# Patient Record
Sex: Female | Born: 1980 | Race: White | Hispanic: No | Marital: Married | Smoking: Never smoker
Health system: Southern US, Community
[De-identification: ages and names within clinical notes are randomized; demographics above are authoritative.]

## PROBLEM LIST (undated history)

## (undated) DIAGNOSIS — E119 Type 2 diabetes mellitus without complications: Secondary | ICD-10-CM

## (undated) DIAGNOSIS — K589 Irritable bowel syndrome without diarrhea: Secondary | ICD-10-CM

## (undated) DIAGNOSIS — F419 Anxiety disorder, unspecified: Secondary | ICD-10-CM

## (undated) DIAGNOSIS — F32A Depression, unspecified: Secondary | ICD-10-CM

## (undated) DIAGNOSIS — F329 Major depressive disorder, single episode, unspecified: Secondary | ICD-10-CM

## (undated) DIAGNOSIS — K219 Gastro-esophageal reflux disease without esophagitis: Secondary | ICD-10-CM

## (undated) DIAGNOSIS — O24419 Gestational diabetes mellitus in pregnancy, unspecified control: Secondary | ICD-10-CM

## (undated) DIAGNOSIS — J45909 Unspecified asthma, uncomplicated: Secondary | ICD-10-CM

## (undated) DIAGNOSIS — M1990 Unspecified osteoarthritis, unspecified site: Secondary | ICD-10-CM

## (undated) DIAGNOSIS — I5030 Unspecified diastolic (congestive) heart failure: Secondary | ICD-10-CM

## (undated) DIAGNOSIS — T7840XA Allergy, unspecified, initial encounter: Secondary | ICD-10-CM

## (undated) HISTORY — PX: APPENDECTOMY: SHX54

## (undated) HISTORY — DX: Unspecified diastolic (congestive) heart failure: I50.30

## (undated) HISTORY — DX: Major depressive disorder, single episode, unspecified: F32.9

## (undated) HISTORY — DX: Allergy, unspecified, initial encounter: T78.40XA

## (undated) HISTORY — DX: Gastro-esophageal reflux disease without esophagitis: K21.9

## (undated) HISTORY — DX: Anxiety disorder, unspecified: F41.9

## (undated) HISTORY — PX: CHOLECYSTECTOMY: SHX55

## (undated) HISTORY — PX: GANGLION CYST EXCISION: SHX1691

## (undated) HISTORY — DX: Unspecified osteoarthritis, unspecified site: M19.90

## (undated) HISTORY — DX: Irritable bowel syndrome, unspecified: K58.9

## (undated) HISTORY — DX: Unspecified asthma, uncomplicated: J45.909

## (undated) HISTORY — DX: Type 2 diabetes mellitus without complications: E11.9

## (undated) HISTORY — DX: Depression, unspecified: F32.A

---

## 1998-06-24 ENCOUNTER — Other Ambulatory Visit: Admission: RE | Admit: 1998-06-24 | Discharge: 1998-06-24 | Payer: Self-pay | Admitting: Obstetrics & Gynecology

## 1999-05-07 ENCOUNTER — Other Ambulatory Visit: Admission: RE | Admit: 1999-05-07 | Discharge: 1999-05-07 | Payer: Self-pay | Admitting: Gastroenterology

## 1999-05-07 ENCOUNTER — Encounter (INDEPENDENT_AMBULATORY_CARE_PROVIDER_SITE_OTHER): Payer: Self-pay | Admitting: Specialist

## 1999-07-08 ENCOUNTER — Other Ambulatory Visit: Admission: RE | Admit: 1999-07-08 | Discharge: 1999-07-08 | Payer: Self-pay | Admitting: Obstetrics & Gynecology

## 2000-07-09 ENCOUNTER — Other Ambulatory Visit: Admission: RE | Admit: 2000-07-09 | Discharge: 2000-07-09 | Payer: Self-pay | Admitting: Obstetrics and Gynecology

## 2000-07-09 ENCOUNTER — Other Ambulatory Visit: Admission: RE | Admit: 2000-07-09 | Discharge: 2000-07-09 | Payer: Self-pay | Admitting: Gynecology

## 2002-09-19 ENCOUNTER — Other Ambulatory Visit: Admission: RE | Admit: 2002-09-19 | Discharge: 2002-09-19 | Payer: Self-pay | Admitting: Obstetrics & Gynecology

## 2003-09-01 ENCOUNTER — Ambulatory Visit (HOSPITAL_BASED_OUTPATIENT_CLINIC_OR_DEPARTMENT_OTHER): Admission: RE | Admit: 2003-09-01 | Discharge: 2003-09-01 | Payer: Self-pay | Admitting: Internal Medicine

## 2003-09-20 ENCOUNTER — Emergency Department (HOSPITAL_COMMUNITY): Admission: EM | Admit: 2003-09-20 | Discharge: 2003-09-20 | Payer: Self-pay | Admitting: Emergency Medicine

## 2003-11-08 ENCOUNTER — Other Ambulatory Visit: Admission: RE | Admit: 2003-11-08 | Discharge: 2003-11-08 | Payer: Self-pay | Admitting: Obstetrics & Gynecology

## 2004-12-03 ENCOUNTER — Other Ambulatory Visit: Admission: RE | Admit: 2004-12-03 | Discharge: 2004-12-03 | Payer: Self-pay | Admitting: Obstetrics & Gynecology

## 2005-04-14 ENCOUNTER — Inpatient Hospital Stay (HOSPITAL_COMMUNITY): Admission: EM | Admit: 2005-04-14 | Discharge: 2005-04-18 | Payer: Self-pay | Admitting: Emergency Medicine

## 2005-04-14 ENCOUNTER — Ambulatory Visit: Payer: Self-pay | Admitting: Internal Medicine

## 2005-12-14 ENCOUNTER — Other Ambulatory Visit: Admission: RE | Admit: 2005-12-14 | Discharge: 2005-12-14 | Payer: Self-pay | Admitting: Obstetrics & Gynecology

## 2008-01-12 ENCOUNTER — Ambulatory Visit (HOSPITAL_BASED_OUTPATIENT_CLINIC_OR_DEPARTMENT_OTHER): Admission: RE | Admit: 2008-01-12 | Discharge: 2008-01-12 | Payer: Self-pay | Admitting: Orthopedic Surgery

## 2009-11-27 ENCOUNTER — Encounter: Admission: RE | Admit: 2009-11-27 | Discharge: 2010-02-25 | Payer: Self-pay | Admitting: Family Medicine

## 2010-01-06 ENCOUNTER — Ambulatory Visit: Payer: Self-pay | Admitting: Psychiatry

## 2010-01-06 ENCOUNTER — Inpatient Hospital Stay (HOSPITAL_COMMUNITY): Admission: RE | Admit: 2010-01-06 | Discharge: 2010-01-12 | Payer: Self-pay | Admitting: Psychiatry

## 2010-01-14 ENCOUNTER — Ambulatory Visit: Payer: Self-pay | Admitting: Psychiatry

## 2010-01-14 ENCOUNTER — Other Ambulatory Visit (HOSPITAL_COMMUNITY): Admission: RE | Admit: 2010-01-14 | Discharge: 2010-01-27 | Payer: Self-pay | Admitting: Psychiatry

## 2010-06-18 ENCOUNTER — Ambulatory Visit (HOSPITAL_COMMUNITY): Admission: RE | Admit: 2010-06-18 | Discharge: 2010-06-18 | Payer: Self-pay | Admitting: Gastroenterology

## 2010-06-19 ENCOUNTER — Emergency Department (HOSPITAL_COMMUNITY): Admission: EM | Admit: 2010-06-19 | Discharge: 2010-06-20 | Payer: Self-pay | Admitting: Emergency Medicine

## 2010-08-01 ENCOUNTER — Ambulatory Visit (HOSPITAL_COMMUNITY): Admission: RE | Admit: 2010-08-01 | Discharge: 2010-08-01 | Payer: Self-pay | Admitting: General Surgery

## 2010-08-04 ENCOUNTER — Ambulatory Visit (HOSPITAL_COMMUNITY): Admission: RE | Admit: 2010-08-04 | Discharge: 2010-08-04 | Payer: Self-pay | Admitting: General Surgery

## 2010-08-11 ENCOUNTER — Encounter: Admission: RE | Admit: 2010-08-11 | Discharge: 2010-08-11 | Payer: Self-pay | Admitting: General Surgery

## 2010-10-23 ENCOUNTER — Encounter
Admission: RE | Admit: 2010-10-23 | Discharge: 2010-11-28 | Payer: Self-pay | Source: Home / Self Care | Attending: Family Medicine | Admitting: Family Medicine

## 2010-11-28 ENCOUNTER — Encounter
Admission: RE | Admit: 2010-11-28 | Discharge: 2010-12-23 | Payer: Self-pay | Source: Home / Self Care | Attending: Family Medicine | Admitting: Family Medicine

## 2011-01-27 ENCOUNTER — Encounter: Payer: Commercial Indemnity | Attending: Family Medicine | Admitting: Dietician

## 2011-01-27 DIAGNOSIS — Z713 Dietary counseling and surveillance: Secondary | ICD-10-CM | POA: Insufficient documentation

## 2011-01-27 DIAGNOSIS — E119 Type 2 diabetes mellitus without complications: Secondary | ICD-10-CM | POA: Insufficient documentation

## 2011-02-05 LAB — URINALYSIS, ROUTINE W REFLEX MICROSCOPIC
Bilirubin Urine: NEGATIVE
Glucose, UA: NEGATIVE mg/dL
Ketones, ur: NEGATIVE mg/dL
Nitrite: NEGATIVE
Protein, ur: NEGATIVE mg/dL
Specific Gravity, Urine: 1.004 — ABNORMAL LOW (ref 1.005–1.030)
Urobilinogen, UA: 0.2 mg/dL (ref 0.0–1.0)
pH: 7 (ref 5.0–8.0)

## 2011-02-05 LAB — AMYLASE: Amylase: 52 U/L (ref 0–105)

## 2011-02-05 LAB — CBC
HCT: 38.3 % (ref 36.0–46.0)
HCT: 41.9 % (ref 36.0–46.0)
Hemoglobin: 12.9 g/dL (ref 12.0–15.0)
Hemoglobin: 14.2 g/dL (ref 12.0–15.0)
MCH: 29.9 pg (ref 26.0–34.0)
MCH: 30 pg (ref 26.0–34.0)
MCHC: 33.8 g/dL (ref 30.0–36.0)
MCHC: 33.8 g/dL (ref 30.0–36.0)
MCV: 88.4 fL (ref 78.0–100.0)
MCV: 88.5 fL (ref 78.0–100.0)
Platelets: 258 10*3/uL (ref 150–400)
Platelets: 324 10*3/uL (ref 150–400)
RBC: 4.33 MIL/uL (ref 3.87–5.11)
RBC: 4.74 MIL/uL (ref 3.87–5.11)
RDW: 13.8 % (ref 11.5–15.5)
RDW: 14.1 % (ref 11.5–15.5)
WBC: 7.9 10*3/uL (ref 4.0–10.5)
WBC: 9.7 10*3/uL (ref 4.0–10.5)

## 2011-02-05 LAB — GLUCOSE, CAPILLARY
Glucose-Capillary: 113 mg/dL — ABNORMAL HIGH (ref 70–99)
Glucose-Capillary: 90 mg/dL (ref 70–99)

## 2011-02-05 LAB — COMPREHENSIVE METABOLIC PANEL
ALT: 21 U/L (ref 0–35)
ALT: 26 U/L (ref 0–35)
AST: 21 U/L (ref 0–37)
AST: 23 U/L (ref 0–37)
Albumin: 3.5 g/dL (ref 3.5–5.2)
Albumin: 3.7 g/dL (ref 3.5–5.2)
Alkaline Phosphatase: 78 U/L (ref 39–117)
Alkaline Phosphatase: 87 U/L (ref 39–117)
BUN: 5 mg/dL — ABNORMAL LOW (ref 6–23)
BUN: 7 mg/dL (ref 6–23)
CO2: 27 mEq/L (ref 19–32)
CO2: 29 mEq/L (ref 19–32)
Calcium: 8.7 mg/dL (ref 8.4–10.5)
Calcium: 9.4 mg/dL (ref 8.4–10.5)
Chloride: 105 mEq/L (ref 96–112)
Chloride: 107 mEq/L (ref 96–112)
Creatinine, Ser: 0.63 mg/dL (ref 0.4–1.2)
Creatinine, Ser: 0.78 mg/dL (ref 0.4–1.2)
GFR calc Af Amer: >60 mL/min (ref >60)
GFR calc Af Amer: >60 mL/min (ref >60)
GFR calc non Af Amer: >60 mL/min (ref >60)
GFR calc non Af Amer: >60 mL/min (ref >60)
Glucose, Bld: 102 mg/dL — ABNORMAL HIGH (ref 70–99)
Glucose, Bld: 117 mg/dL — ABNORMAL HIGH (ref 70–99)
Potassium: 3.8 mEq/L (ref 3.5–5.1)
Potassium: 3.9 mEq/L (ref 3.5–5.1)
Sodium: 137 mEq/L (ref 135–145)
Sodium: 143 mEq/L (ref 135–145)
Total Bilirubin: 0.3 mg/dL (ref 0.3–1.2)
Total Bilirubin: 0.5 mg/dL (ref 0.3–1.2)
Total Protein: 7.1 g/dL (ref 6.0–8.3)
Total Protein: 7.3 g/dL (ref 6.0–8.3)

## 2011-02-05 LAB — DIFFERENTIAL
Basophils Absolute: 0 10*3/uL (ref 0.0–0.1)
Basophils Absolute: 0.1 10*3/uL (ref 0.0–0.1)
Basophils Relative: 1 % (ref 0–1)
Basophils Relative: 1 % (ref 0–1)
Eosinophils Absolute: 0.1 10*3/uL (ref 0.0–0.7)
Eosinophils Absolute: 0.1 10*3/uL (ref 0.0–0.7)
Eosinophils Relative: 1 % (ref 0–5)
Eosinophils Relative: 1 % (ref 0–5)
Lymphocytes Relative: 20 % (ref 12–46)
Lymphocytes Relative: 25 % (ref 12–46)
Lymphs Abs: 1.6 10*3/uL (ref 0.7–4.0)
Lymphs Abs: 2.5 10*3/uL (ref 0.7–4.0)
Monocytes Absolute: 0.3 10*3/uL (ref 0.1–1.0)
Monocytes Absolute: 0.7 10*3/uL (ref 0.1–1.0)
Monocytes Relative: 4 % (ref 3–12)
Monocytes Relative: 7 % (ref 3–12)
Neutro Abs: 5.8 10*3/uL (ref 1.7–7.7)
Neutro Abs: 6.4 10*3/uL (ref 1.7–7.7)
Neutrophils Relative %: 66 % (ref 43–77)
Neutrophils Relative %: 74 % (ref 43–77)

## 2011-02-05 LAB — SURGICAL PCR SCREEN: Staphylococcus aureus: POSITIVE — AB

## 2011-02-05 LAB — LIPASE, BLOOD: Lipase: 22 U/L (ref 11–59)

## 2011-02-05 LAB — URINE MICROSCOPIC-ADD ON

## 2011-02-05 LAB — PREGNANCY, URINE: Preg Test, Ur: NEGATIVE

## 2011-02-07 LAB — COMPREHENSIVE METABOLIC PANEL
ALT: 18 U/L (ref 0–35)
AST: 20 U/L (ref 0–37)
Albumin: 3.5 g/dL (ref 3.5–5.2)
Alkaline Phosphatase: 71 U/L (ref 39–117)
BUN: 10 mg/dL (ref 6–23)
CO2: 25 mEq/L (ref 19–32)
Calcium: 9 mg/dL (ref 8.4–10.5)
Chloride: 106 mEq/L (ref 96–112)
Creatinine, Ser: 0.72 mg/dL (ref 0.4–1.2)
GFR calc Af Amer: >60 mL/min (ref >60)
GFR calc non Af Amer: >60 mL/min (ref >60)
Glucose, Bld: 95 mg/dL (ref 70–99)
Potassium: 3.2 mEq/L — ABNORMAL LOW (ref 3.5–5.1)
Sodium: 138 mEq/L (ref 135–145)
Total Bilirubin: 0.5 mg/dL (ref 0.3–1.2)
Total Protein: 6.9 g/dL (ref 6.0–8.3)

## 2011-02-07 LAB — DIFFERENTIAL
Basophils Absolute: 0 10*3/uL (ref 0.0–0.1)
Basophils Relative: 0 % (ref 0–1)
Eosinophils Absolute: 0 10*3/uL (ref 0.0–0.7)
Lymphs Abs: 1.9 10*3/uL (ref 0.7–4.0)
Neutrophils Relative %: 77 % (ref 43–77)

## 2011-02-07 LAB — CBC
HCT: 38.5 % (ref 36.0–46.0)
Hemoglobin: 13.3 g/dL (ref 12.0–15.0)
MCH: 30.3 pg (ref 26.0–34.0)
MCHC: 34.5 g/dL (ref 30.0–36.0)
MCV: 87.7 fL (ref 78.0–100.0)
Platelets: 278 10*3/uL (ref 150–400)
RBC: 4.39 MIL/uL (ref 3.87–5.11)
RDW: 13.1 % (ref 11.5–15.5)
WBC: 11 10*3/uL — ABNORMAL HIGH (ref 4.0–10.5)

## 2011-02-07 LAB — URINALYSIS, ROUTINE W REFLEX MICROSCOPIC
Ketones, ur: NEGATIVE mg/dL
Nitrite: NEGATIVE
Protein, ur: NEGATIVE mg/dL
Urobilinogen, UA: 0.2 mg/dL (ref 0.0–1.0)
pH: 6.5 (ref 5.0–8.0)

## 2011-02-07 LAB — LIPASE, BLOOD: Lipase: 25 U/L (ref 11–59)

## 2011-02-12 LAB — DRUGS OF ABUSE SCREEN W/O ALC, ROUTINE URINE
Amphetamine Screen, Ur: NEGATIVE
Barbiturate Quant, Ur: NEGATIVE
Marijuana Metabolite: NEGATIVE
Methadone: NEGATIVE
Phencyclidine (PCP): NEGATIVE

## 2011-02-12 LAB — URINALYSIS, MICROSCOPIC ONLY
Bilirubin Urine: NEGATIVE
Glucose, UA: NEGATIVE mg/dL
Ketones, ur: NEGATIVE mg/dL
Specific Gravity, Urine: 1.03 (ref 1.005–1.030)
pH: 6.5 (ref 5.0–8.0)

## 2011-02-12 LAB — PREGNANCY, URINE: Preg Test, Ur: NEGATIVE

## 2011-02-12 LAB — CBC
HCT: 39.7 % (ref 36.0–46.0)
Hemoglobin: 13.6 g/dL (ref 12.0–15.0)
Platelets: 248 10*3/uL (ref 150–400)
WBC: 3.8 10*3/uL — ABNORMAL LOW (ref 4.0–10.5)

## 2011-02-12 LAB — URINE DRUGS OF ABUSE SCREEN W ALC, ROUTINE (REF LAB)
Barbiturate Quant, Ur: NEGATIVE
Benzodiazepines.: NEGATIVE
Cocaine Metabolites: NEGATIVE
Creatinine,U: 81 mg/dL
Methadone: NEGATIVE
Phencyclidine (PCP): NEGATIVE
Propoxyphene: NEGATIVE

## 2011-02-12 LAB — COMPREHENSIVE METABOLIC PANEL
ALT: 20 U/L (ref 0–35)
Albumin: 3.3 g/dL — ABNORMAL LOW (ref 3.5–5.2)
Alkaline Phosphatase: 61 U/L (ref 39–117)
BUN: 5 mg/dL — ABNORMAL LOW (ref 6–23)
Chloride: 106 mEq/L (ref 96–112)
Glucose, Bld: 115 mg/dL — ABNORMAL HIGH (ref 70–99)
Potassium: 3.1 mEq/L — ABNORMAL LOW (ref 3.5–5.1)
Sodium: 139 mEq/L (ref 135–145)
Total Bilirubin: 0.2 mg/dL — ABNORMAL LOW (ref 0.3–1.2)
Total Protein: 6.6 g/dL (ref 6.0–8.3)

## 2011-03-30 ENCOUNTER — Other Ambulatory Visit: Payer: Self-pay | Admitting: Obstetrics & Gynecology

## 2011-04-07 NOTE — Op Note (Signed)
[name], [name]               ACCOUNT NO.:  [id number]   MEDICAL RECORD NO.:  [id number]          PATIENT TYPE:  AMB   LOCATION:  DSC                          FACILITY:  [facility]   PHYSICIAN:  Rodney A. Mortenson, M.D.DATE OF BIRTH:  Nov 18, 1981   DATE OF PROCEDURE:  01/12/2008  DATE OF DISCHARGE:                               OPERATIVE REPORT   JUSTIFICATION:  30 year old female has had increasing pain about right  wrist the past three or four months.  She does repetitive motion typing,  lifting and noted to have a ganglion on the dorsum of the right wrist.  She was seen in October of 2008.  This was treated conservatively.  An  attempted aspiration was done.  This was unsuccessful and the ganglion  continued to grow and become painful.  The patient now wishes to have  surgical excision.  Questions answered and encouraged.  Complications  discussed extensively preoperatively, recurrence of ganglion and  multiple other complications and the patient wished to proceed.   JUSTIFICATION FOR OUTPATIENT SURGERY:  Minimal morbidity.   PREOPERATIVE DIAGNOSIS:  Ganglion of the dorsal aspect of the right  wrist.   POSTOPERATIVE DIAGNOSIS:  Ganglion of the dorsal aspect of the right  wrist.   OPERATION:  Excision ganglion dorsal aspect right wrist arising from the  radial, lunate, scaphoid articulation   SURGEON:  Thereasa Distance A. Chaney Malling, M.D.   ANESTHESIA:  General.   PROCEDURE:  The patient placed on the operating table in supine  position.  Pneumatic tourniquet about the right upper arm.  Right upper  extremity prepped with DuraPrep and draped out in the usual manner.  The  arm was then wrapped out in Esmarch, tourniquet was elevated.  Loupe  magnification was used throughout.  Transverse incision following a skin  crease on dorsal aspect of the wrist was done.  Blunt dissection was  then used to get down to the dorsal capsule.  Bleeders were coagulated.  Small retractor placed about  the wound.  Blunt dissection carried down  to the base of the dorsal capsule and this was fairly large sessile  footprint to the gangrene.  The capsule was then incised with scissors  and the entire ganglion excised as a single large mass.  The scaphoid,  lunate, radial articulation could be seen.  The wound was dry.  A small  piece Gelfoam was then placed in the wound and 3-0 subcuticular suture  and Steri-Strips were applied.  The area was then infiltrated with  Marcaine.  A large bulky sterile dressing was applied and the patient  returned to recovery room in excellent condition.  Technically this  procedure went extremely well.   FOLLOW-UP CARE:  1. Darvocet for pain.  2. Return to my office on Friday.  3. Usual postop instructions were given.      Rodney A. Chaney Malling, M.D.  Electronically Signed     RAM/MEDQ  D:  01/12/2008  T:  01/13/2008  Job:  [id number]
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
## 2011-04-10 NOTE — Op Note (Signed)
NAMETYERRA, LORETTO NO.:  [id number]   MEDICAL RECORD NO.:  [id number]          PATIENT TYPE:  INP   LOCATION:                               FACILITY:  [facility]   PHYSICIAN:  Doylene Canning. Ladona Ridgel, M.D.  DATE OF BIRTH:  08/06/1981   DATE OF PROCEDURE:  04/17/2005  DATE OF DISCHARGE:                                 OPERATIVE REPORT   PROCEDURE PERFORMED:  Head-up tilt table testing.   INDICATIONS:  Unexplained syncope.   I INTRODUCTION:  The patient is a 30 year old woman who was admitted to  hospital after sustaining a syncopal episode. She carries a questionable  history of prolongation of her QT interval, but has had no family history of  sudden cardiac death. Her 2-D echo demonstrated normal LV systolic function  with no segmental wall motion abnormalities and no evidence of hypertrophic  cardiomyopathy. The patient has had a history of hypoglycemia. She is now  referred for head-up tilt table testing.   II. PROCEDURE:  After informed consent was obtained, the patient was taken  to the diagnostic catheterization lab in the fasting state. After the usual  preparation, she was placed in the supine position where blood pressure was  in the 120s with heart rates in the 70s.   She was placed in the 70 degree, head-up, tilt table position. Her heart  rate increased from the mid-70s,  appropriately, up into the mid-to-high 80s  and finally up into the mid-90s. Her blood pressure remained stable without  any significant change. She was maintained in this position for  approximately 30 minutes. During this time her heart rate remained 90 range  and her blood pressure remained in the 120-130 range. She was subsequently  returned back to supine position and isoproterenol at 0.5 mcg/min was  infused. Despite an increase in her heart rate. The patient had no  significant change in her blood pressure. She complained of anxiousness, and  actually began to cry when her blood  pressure cuff was inflated. After an  initial 10 minutes of tilting on isoproterenol without any syncope. The  patient was returned back to the supine position.  Her isoproterenol was  discontinued and she was returned to her room in satisfactory condition.   III. COMPLICATIONS:  There were no major procedure complications.   IV. RESULTS:  This study demonstrates negative head-up tilt table testing  for inducible syncope and no evidence of any venous pooling.       ___________________________________________  Doylene Canning. Ladona Ridgel, M.D.    GWT/MEDQ  D:  04/17/2005  T:  04/17/2005  Job:  [id number]   cc:   Vale Haven. Andrey Campanile, M.D.  [address]  [place]  [place] [address]  Fax: [phone]

## 2011-04-28 ENCOUNTER — Ambulatory Visit: Payer: Self-pay | Admitting: Dietician

## 2011-08-14 LAB — BASIC METABOLIC PANEL
BUN: 8
CO2: 25
Chloride: 106
Creatinine, Ser: 0.64
Glucose, Bld: 83
Potassium: 4.1

## 2011-08-14 LAB — POCT HEMOGLOBIN-HEMACUE: Hemoglobin: 13.6

## 2012-09-16 IMAGING — CT CT ANGIO CHEST
2 of 6 series · 18 of 36 positions shown · IV contrast ([ID] OMNI 300)
Comparison: None.

CLINICAL DATA: Pleuritic right sided pain, cough

CT ANGIOGRAPHY CHEST WITH CONTRAST
TECHNIQUE: Multidetector CT imaging of the chest was performed
using the standard protocol during bolus administration of
intravenous contrast.  Multiplanar CT image reconstructions
including MIPs were obtained to evaluate the vascular anatomy.
Contrast:  125 ml [id number]

[Series 6: pe thin 1.25 · axial · 0.60mm/px · z∈[-228,+6]mm · 17 of 211 slices shown]
[im 12/211  lung]
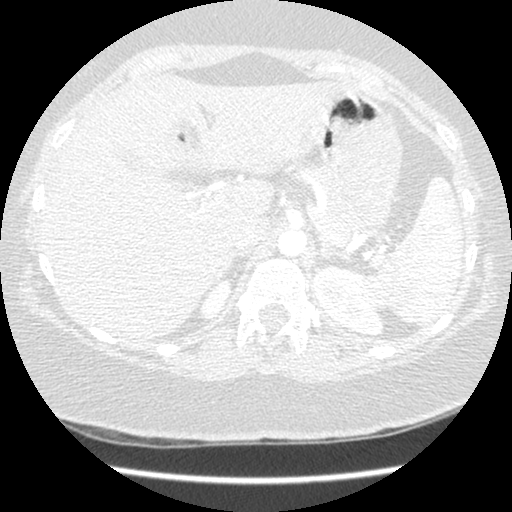
[im 24/211  mediastinal]
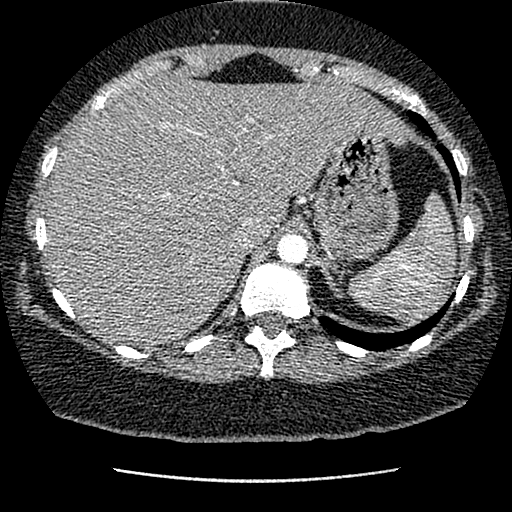
[im 36/211  lung]
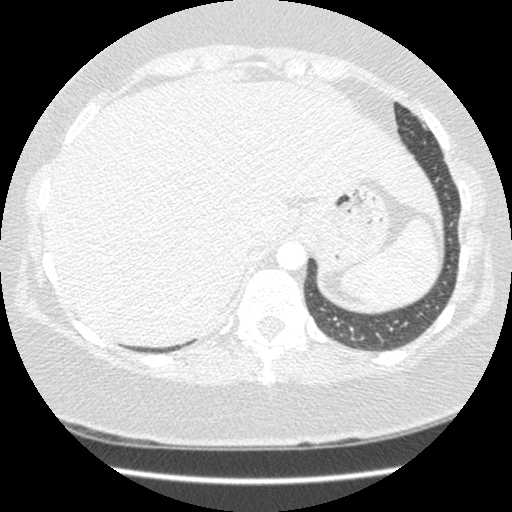
[im 47/211  mediastinal]
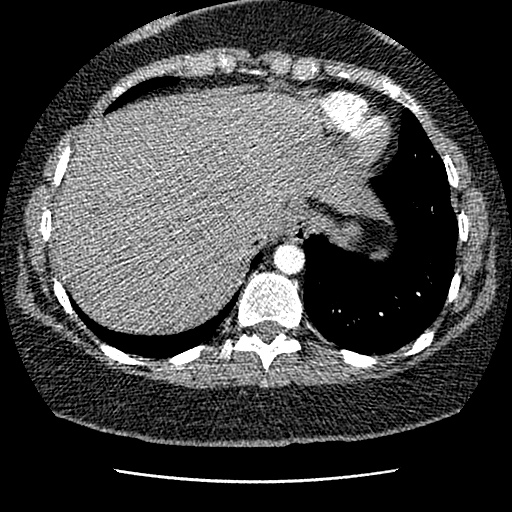
[im 59/211  lung]
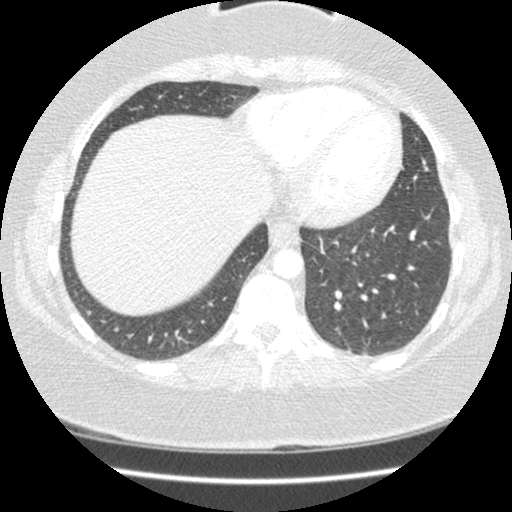
[im 71/211  mediastinal]
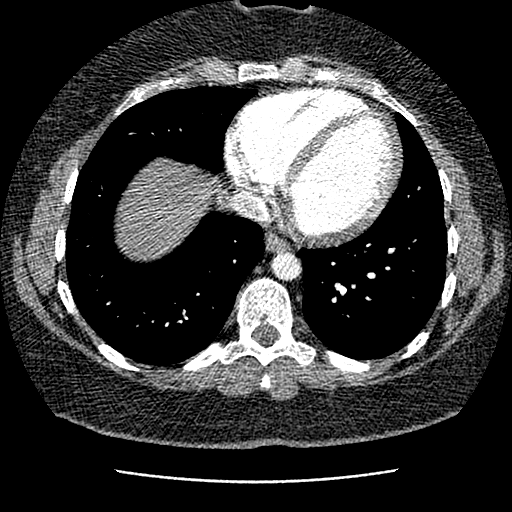
[im 82/211  lung]
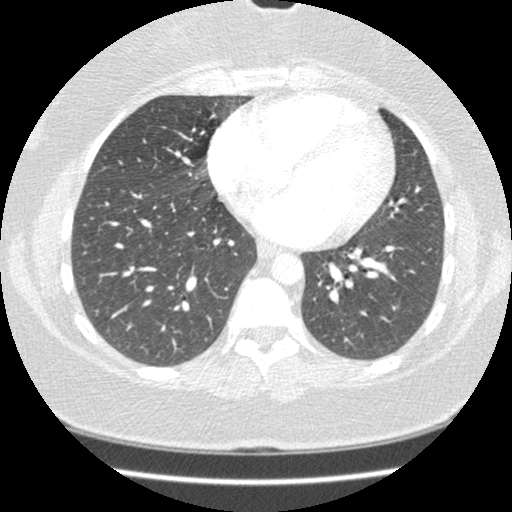
[im 94/211  mediastinal]
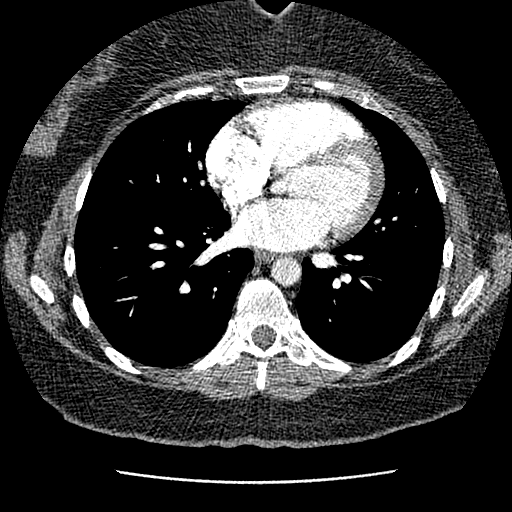
[im 106/211  lung]
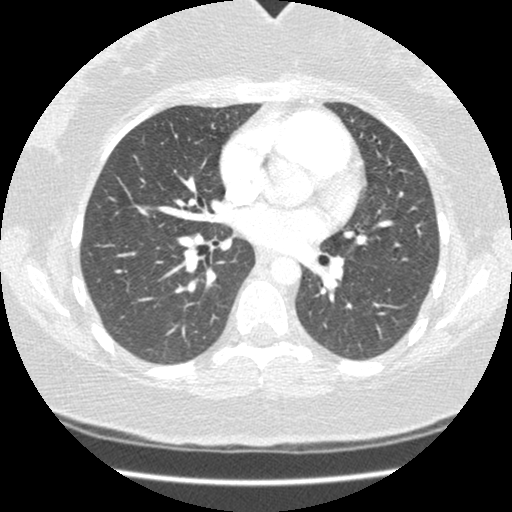
[im 117/211  mediastinal]
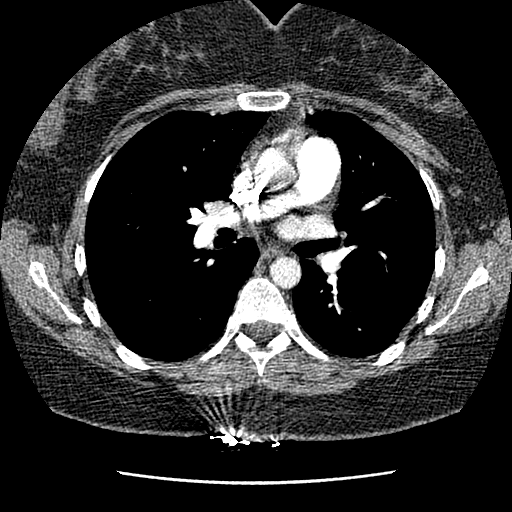
[im 129/211  lung]
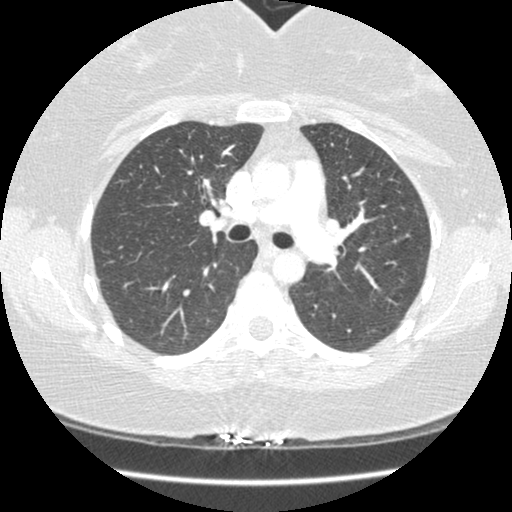
[im 141/211  mediastinal]
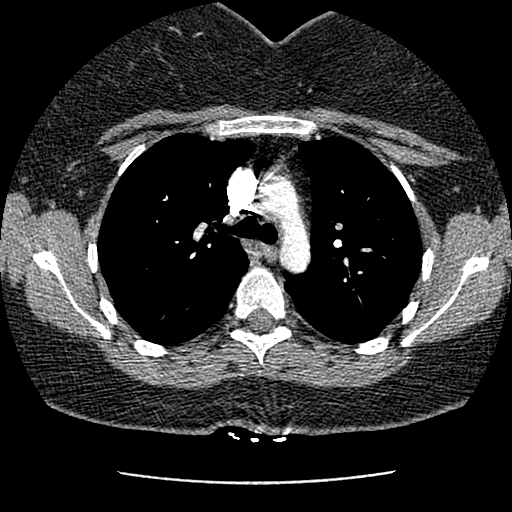
[im 152/211  lung]
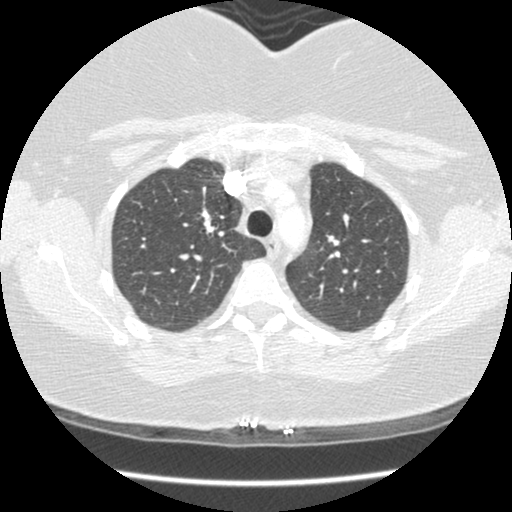
[im 164/211  mediastinal]
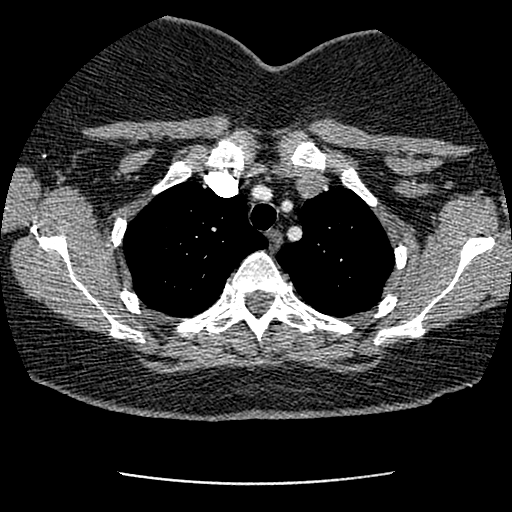
[im 176/211  lung]
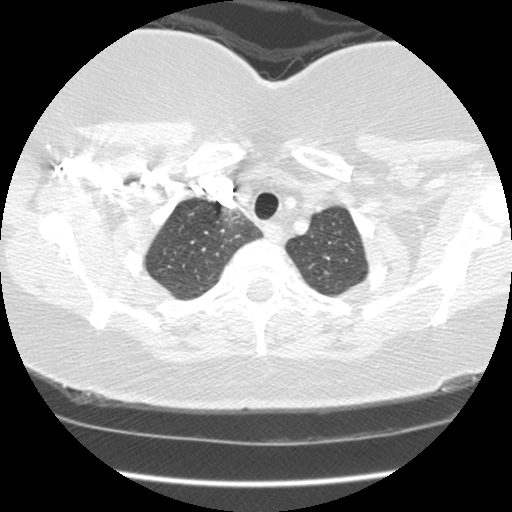
[im 187/211  mediastinal]
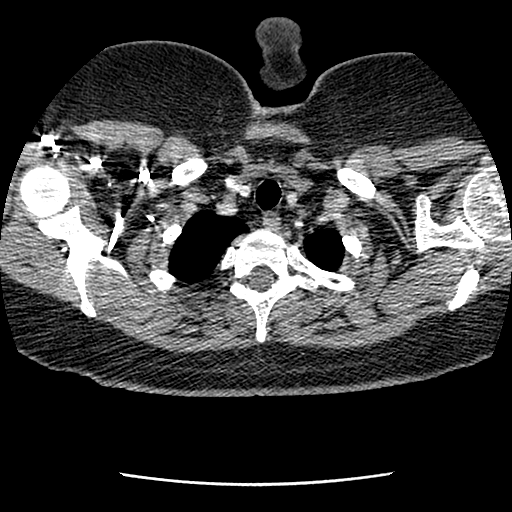
[im 199/211  lung]
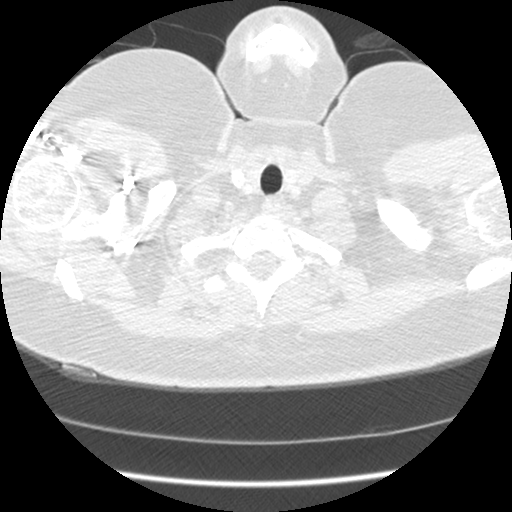

[Series 105: cor · coronal · 0.60mm/px · 1 of 105 slices shown]
[im 53/105  mediastinal]
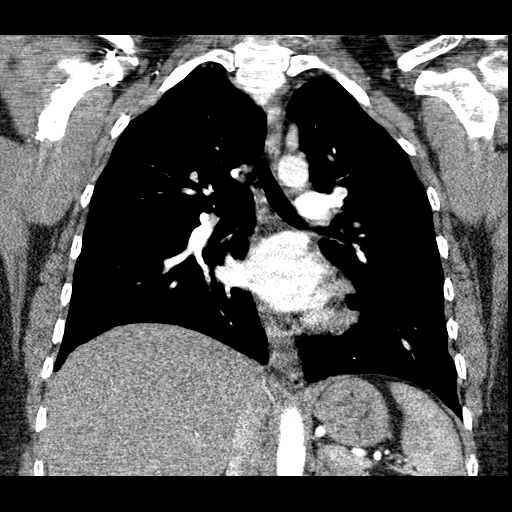

[18 of 36 positions shown; findings below may reference images not displayed]

FINDINGS: The pulmonary arteries opacify and there is no evidence
of pulmonary embolism.  Streak artifacts are noted particularly in
the lower lung fields due to patient body habitus.  The thoracic
aorta opacifies with no significant abnormality noted.  No
mediastinal or hilar adenopathy is seen.

On the lung window images, no focal infiltrate is seen.  There is
no evidence of pleural effusion.  No bony abnormality is seen.  The
origins of the great vessels are patent. Surgical clips are present
in the right upper quadrant from recent cholecystectomy.

Review of the MIP images confirms the above findings.
IMPRESSION: No evidence of pulmonary embolism.  Negative CT angiogram of the
chest.

## 2012-10-13 ENCOUNTER — Ambulatory Visit (INDEPENDENT_AMBULATORY_CARE_PROVIDER_SITE_OTHER): Payer: Managed Care, Other (non HMO) | Admitting: Family Medicine

## 2012-10-13 VITALS — BP 116/76 | HR 103 | Temp 98.4°F | Resp 16 | Ht 62.5 in | Wt 196.0 lb

## 2012-10-13 DIAGNOSIS — K645 Perianal venous thrombosis: Secondary | ICD-10-CM

## 2012-10-13 DIAGNOSIS — K6289 Other specified diseases of anus and rectum: Secondary | ICD-10-CM

## 2012-10-13 MED ORDER — HYDROCORTISONE ACETATE 25 MG RE SUPP: 25.0000 mg | Freq: Two times a day (BID) | RECTAL | Status: DC | PRN

## 2012-10-13 MED ORDER — HYDROCORTISONE 2.5 % RE CREA: TOPICAL_CREAM | Freq: Two times a day (BID) | RECTAL | Status: DC

## 2012-10-13 MED ORDER — LIDOCAINE (ANORECTAL) 5 % EX CREA: 1.0000 "application " | TOPICAL_CREAM | Freq: Three times a day (TID) | CUTANEOUS | Status: DC | PRN

## 2012-10-13 NOTE — Progress Notes (Signed)
[facility]:  Office Visit  Chief Complaint:  Chief Complaint  Patient presents with  . Hemorrhoids    HPI: [name] is a 31 y.o. female who complains of  H/o IBS constipation, has had multiple episodes of nonbloody diarrhea, bloating, + itching in last 3 days started having worsening rectal pain with BM. She has never had hemorrhoids before. Has not tried anything for this except her usual IBS meds. Denies any melena/hematochezia  Past Medical History  Diagnosis Date  . Allergy   . Arthritis   . Anxiety   . Asthma   . Depression   . GERD (gastroesophageal reflux disease)   . IBS (irritable bowel syndrome)   . Diabetes mellitus without complication    Past Surgical History  Procedure Date  . Cholecystectomy   . Ganglion cyst excision    History   Social History  . Marital Status: Married    Spouse Name: N/A    Number of Children: N/A  . Years of Education: N/A   Social History Main Topics  . Smoking status: Never Smoker   . Smokeless tobacco: None  . Alcohol Use: No  . Drug Use: No  . Sexually Active: Yes    Birth Control/ Protection: Pill   Other Topics Concern  . None   Social History Narrative  . None   Family History  Problem Relation Age of Onset  . Diabetes Mother   . Hypertension Mother   . Arthritis Mother   . Diabetes Father   . Heart disease Father   . Depression Father    Allergies  Allergen Reactions  . Codeine Nausea And Vomiting  . Levaquin (Levofloxacin In D5w)   . Seroquel (Quetiapine Fumarate)   . Vicodin (Hydrocodone-Acetaminophen) Nausea And Vomiting  . Wellbutrin (Bupropion) Other (See Comments)    Seizures   Prior to Admission medications   Medication Sig Start Date End Date Taking? Authorizing Provider  cholecalciferol (VITAMIN D) 1000 UNITS tablet Take 1,000 Units by mouth daily.   Yes Historical Provider, MD  clorazepate (TRANXENE) 7.5 MG tablet Take 7.5 mg by mouth 2 (two) times daily as needed.   Yes  Historical Provider, MD  FLUoxetine (PROZAC) 20 MG capsule Take 20 mg by mouth daily.   Yes Historical Provider, MD  Linaclotide (LINZESS) 145 MCG CAPS Take 145 mcg by mouth daily.   Yes Historical Provider, MD  Multiple Vitamin (MULTIVITAMIN) tablet Take 1 tablet by mouth daily.   Yes Historical Provider, MD  Norethindrone-Ethinyl Estradiol-Fe Biphas (LO LOESTRIN FE) 1 MG-10 MCG / 10 MCG tablet Take 1 tablet by mouth daily.   Yes Historical Provider, MD  omeprazole (PRILOSEC) 20 MG capsule Take 20 mg by mouth daily.   Yes Historical Provider, MD  phentermine 37.5 MG capsule Take 37.5 mg by mouth every morning.   Yes Historical Provider, MD  Probiotic Product (ALIGN PO) Take by mouth daily.   Yes Historical Provider, MD     ROS: The patient denies fevers, chills, night sweats, unintentional weight loss, chest pain, palpitations, wheezing, dyspnea on exertion, nausea, vomiting, abdominal pain, dysuria, hematuria, melena, numbness, weakness, or tingling.   All other systems have been reviewed and were otherwise negative with the exception of those mentioned in the HPI and as above.    PHYSICAL EXAM: Filed Vitals:   10/13/12 1813  BP: 116/76  Pulse: 103  Temp: 98.4 F (36.9 C)  Resp: 16   Filed Vitals:   10/13/12 1813  Height:  5' 2.5" (1.588 m)  Weight: 196 lb (88.905 kg)   Body mass index is 35.28 kg/(m^2).  General: Alert, no acute distress HEENT:  Normocephalic, atraumatic, oropharynx patent.  Cardiovascular:  Regular rate and rhythm, no rubs murmurs or gallops.  No Carotid bruits, radial pulse intact. No pedal edema.  Respiratory: Clear to auscultation bilaterally.  No wheezes, rales, or rhonchi.  No cyanosis, no use of accessory musculature GI: No organomegaly, abdomen is soft and non-tender, positive bowel sounds.  No masses. Skin: No rashes. Neurologic: Facial musculature symmetric. Psychiatric: Patient is appropriate throughout our interaction. Lymphatic: No cervical  lymphadenopathy Musculoskeletal: Gait intact. Rectal exam-+ 1 thrombosed external hemorrhoid, no internal hemorrhoids appreciated   LABS: Results for orders placed during the hospital encounter of 08/04/10  AMYLASE      Component Value Range   Amylase 52  0 - 105 U/L  CBC      Component Value Range   WBC 9.7  4.0 - 10.5 K/uL   RBC 4.74  3.87 - 5.11 MIL/uL   Hemoglobin 14.2  12.0 - 15.0 g/dL   HCT 78.2  95.6 - 21.3 %   MCV 88.5  78.0 - 100.0 fL   MCH 30.0  26.0 - 34.0 pg   MCHC 33.8  30.0 - 36.0 g/dL   RDW 08.6  57.8 - 46.9 %   Platelets 324  150 - 400 K/uL  COMPREHENSIVE METABOLIC PANEL      Component Value Range   Sodium 143  135 - 145 mEq/L   Potassium 3.8  3.5 - 5.1 mEq/L   Chloride 107  96 - 112 mEq/L   CO2 29  19 - 32 mEq/L   Glucose, Bld 102 (*) 70 - 99 mg/dL   BUN 7  6 - 23 mg/dL   Creatinine, Ser 6.29  0.4 - 1.2 mg/dL   Calcium 9.4  8.4 - 52.8 mg/dL   Total Protein 7.3  6.0 - 8.3 g/dL   Albumin 3.7  3.5 - 5.2 g/dL   AST 21  0 - 37 U/L   ALT 26  0 - 35 U/L   Alkaline Phosphatase 78  39 - 117 U/L   Total Bilirubin 0.3  0.3 - 1.2 mg/dL   GFR calc non Af Amer >60  >60 mL/min   GFR calc Af Amer    >60 mL/min   Value: >60            The eGFR has been calculated     using the MDRD equation.     This calculation has not been     validated in all clinical     situations.     eGFR's persistently     <60 mL/min signify     possible Chronic Kidney Disease.  DIFFERENTIAL      Component Value Range   Neutrophils Relative 66  43 - 77 %   Neutro Abs 6.4  1.7 - 7.7 K/uL   Lymphocytes Relative 25  12 - 46 %   Lymphs Abs 2.5  0.7 - 4.0 K/uL   Monocytes Relative 7  3 - 12 %   Monocytes Absolute 0.7  0.1 - 1.0 K/uL   Eosinophils Relative 1  0 - 5 %   Eosinophils Absolute 0.1  0.0 - 0.7 K/uL   Basophils Relative 1  0 - 1 %   Basophils Absolute 0.1  0.0 - 0.1 K/uL  LIPASE, BLOOD      Component Value Range  Lipase 22  11 - 59 U/L     EKG/XRAY:   Primary read  interpreted by Dr. Conley Rolls at [facility].   ASSESSMENT/PLAN: Encounter Diagnosis  Name Primary?  . Hemorrhoid thrombosis Yes   S/p I&D Wound care as directed Rx Anusol suppositories and rcream Also rx lidocaine cream for pain prn F/u as directed   LE, THAO PHUONG, DO 10/13/2012 7:01 PM

## 2012-10-13 NOTE — Progress Notes (Signed)
  Subjective:    Patient ID: [name], female    DOB: 03/20/1981, 31 y.o.   MRN: [id number]  HPI    Review of Systems     Objective:   Physical Exam   Patient consented to procedure:  patient placed in Left lateral decubitus position.Betadine prep X 2. 2.5 cc lidocaine with epi injected locally into hemorrhoid. #11 blade to make 1 cm incision.  Dissected down to thromboses with iris scissors and hemostats. Multiple clots extracted. No thromboses remain.     Assessment & Plan:  Wound care discussed.  Sitz baths. Maxi pad for bandaging.

## 2012-10-14 NOTE — Patient Instructions (Addendum)

## 2013-04-16 ENCOUNTER — Ambulatory Visit (INDEPENDENT_AMBULATORY_CARE_PROVIDER_SITE_OTHER): Payer: Managed Care, Other (non HMO) | Admitting: Family Medicine

## 2013-04-16 VITALS — BP 120/82 | HR 90 | Temp 99.1°F | Resp 18 | Ht 63.0 in | Wt 195.0 lb

## 2013-04-16 DIAGNOSIS — T7840XA Allergy, unspecified, initial encounter: Secondary | ICD-10-CM

## 2013-04-16 DIAGNOSIS — F329 Major depressive disorder, single episode, unspecified: Secondary | ICD-10-CM | POA: Insufficient documentation

## 2013-04-16 DIAGNOSIS — F32A Depression, unspecified: Secondary | ICD-10-CM | POA: Insufficient documentation

## 2013-04-16 DIAGNOSIS — E119 Type 2 diabetes mellitus without complications: Secondary | ICD-10-CM

## 2013-04-16 MED ORDER — PREDNISONE 20 MG PO TABS: ORAL_TABLET | ORAL | Status: DC

## 2013-04-16 NOTE — Progress Notes (Signed)
[facility] [address] [place] [address] [phone]  Date:  04/16/2013   Name:  [name]   DOB:  11/08/1981   MRN:  [id number]  PCP:  Delorse Lek, MD    Chief Complaint: Allergic Reaction   History of Present Illness:  [name] is a 32 y.o. very pleasant female patient who presents with the following:  Yesterday she participated in a "color run." She got paint and powder on herself. The powder consisted of cornstarch and neon paint. She smelled sulfur in the air at the end of the race.  After the race was over she started sneezing- she is still sneezing a lot today.   She has also noted runny nose.  She tried an OTC steroid nasal spray- nasacort. She has also taken claritin- once last night.    She is taking fetzima which is a new SNRI for depression.  She started taking this about one month ago, and is not on any other new medications.  She is tapering off of cymbalta and taking just a little bit now prn.    She has a history of diabetes.  However, she does not currently have diabetes.   She had one seizure thought due to wellbutrin in 2006.  No seizures since, no seizure medication.  She is not thought to have epilepsy  Her face, feet and hands now feel itchy.  She does not have any lip or tongue swelling, wheezing, or SOB. The main problem is an intensely itchy and runny nose and sneezing.     She recently took a course of prednisone (in February) and did well  There are no active problems to display for this patient.   Past Medical History  Diagnosis Date  . Allergy   . Arthritis   . Anxiety   . Asthma   . Depression   . GERD (gastroesophageal reflux disease)   . IBS (irritable bowel syndrome)   . Diabetes mellitus without complication     Past Surgical History  Procedure Laterality Date  . Cholecystectomy    . Ganglion cyst excision    . Appendectomy    . Coronary artery bypass graft    pt has NOT had CBAG- this is an  error  History  Substance Use Topics  . Smoking status: Never Smoker   . Smokeless tobacco: Not on file  . Alcohol Use: No    Family History  Problem Relation Age of Onset  . Diabetes Mother   . Hypertension Mother   . Arthritis Mother   . Diabetes Father   . Heart disease Father   . Depression Father     Allergies  Allergen Reactions  . Codeine Nausea And Vomiting  . Levaquin (Levofloxacin In D5w)   . Seroquel (Quetiapine Fumarate)   . Vicodin (Hydrocodone-Acetaminophen) Nausea And Vomiting  . Wellbutrin (Bupropion) Other (See Comments)    Seizures    Medication list has been reviewed and updated.  Current Outpatient Prescriptions on File Prior to Visit  Medication Sig Dispense Refill  . Linaclotide (LINZESS) 145 MCG CAPS Take 145 mcg by mouth daily.      . Multiple Vitamin (MULTIVITAMIN) tablet Take 1 tablet by mouth daily.      . Norethindrone-Ethinyl Estradiol-Fe Biphas (LO LOESTRIN FE) 1 MG-10 MCG / 10 MCG tablet Take 1 tablet by mouth daily.      Marland Kitchen omeprazole (PRILOSEC) 20 MG capsule Take 20 mg by mouth daily.      Marland Kitchen  phentermine 37.5 MG capsule Take 37.5 mg by mouth every morning.      . Probiotic Product (ALIGN PO) Take by mouth daily.      . cholecalciferol (VITAMIN D) 1000 UNITS tablet Take 1,000 Units by mouth daily.      . clorazepate (TRANXENE) 7.5 MG tablet Take 7.5 mg by mouth 2 (two) times daily as needed.      Marland Kitchen FLUoxetine (PROZAC) 20 MG capsule Take 20 mg by mouth daily.      . hydrocortisone (ANUSOL-HC) 2.5 % rectal cream Place rectally 2 (two) times daily.  30 g  0  . hydrocortisone (ANUSOL-HC) 25 MG suppository Place 1 suppository (25 mg total) rectally 2 (two) times daily as needed for hemorrhoids.  12 suppository  0  . Lidocaine, Anorectal, 5 % CREA Apply 1 application topically 3 (three) times daily as needed.  30 g  1   No current facility-administered medications on file prior to visit.    Review of Systems:  As per HPI- otherwise  negative.   Physical Examination: Filed Vitals:   04/16/13 1805  BP: 120/82  Pulse: 96  Temp: 99.1 F (37.3 C)  Resp: 18   Filed Vitals:   04/16/13 1805  Height: 5\' 3"  (1.6 m)  Weight: 195 lb (88.451 kg)   Body mass index is 34.55 kg/(m^2). Ideal Body Weight: Weight in (lb) to have BMI = 25: 140.8  GEN: WDWN, NAD, Non-toxic, A & O x 3, overweight HEENT: Atraumatic, Normocephalic. Neck supple. No masses, No LAD.  Runny and red nose, congested nasal cavity.  Bilateral TM wnl, oropharynx normal.  PEERL,EOMI.  No sign of angioedema Ears and Nose: No external deformity. CV: RRR, No M/G/R. No JVD. No thrill. No extra heart sounds. PULM: CTA B, no wheezes, crackles, rhonchi. No retractions. No resp. distress. No accessory muscle use. EXTR: No c/c/e.  No rash or hives NEURO Normal gait.  PSYCH: Normally interactive. Conversant. Not depressed or anxious appearing.  Calm demeanor.   Results for orders placed in visit on 04/16/13  GLUCOSE, POCT (MANUAL RESULT ENTRY)      Result Value Range   POC Glucose 135 (*) 70 - 99 mg/dl    Assessment and Plan: Allergic reaction, initial encounter  Diabetes - Plan: POCT glucose (manual entry), predniSONE (DELTASONE) 20 MG tablet  [name] is here today with an allergic reaction to substances encountered at a "color run" race yesterday.  Her main problem is sneezing and nasal symptoms.  She would like to use a short course of prednisone as benadryl sometimes makes her too drowsy.  Offered an epi- pen but she declined at this time.  Cautioned regarding signs of a worsening or severe allergic rxn, in which case she should seek help right away.    Signed Abbe Amsterdam, MD

## 2013-04-16 NOTE — Patient Instructions (Addendum)
You may continue to use claritin as needed.  Use the prednisone as directed.  If you develop any signs of a severe allergic reaction such as wheezing, mouth or throat swelling, or difficulty breathing get help right away

## 2013-05-16 ENCOUNTER — Other Ambulatory Visit: Payer: Self-pay | Admitting: Obstetrics & Gynecology

## 2013-10-02 ENCOUNTER — Ambulatory Visit (INDEPENDENT_AMBULATORY_CARE_PROVIDER_SITE_OTHER): Payer: Managed Care, Other (non HMO) | Admitting: Internal Medicine

## 2013-10-02 VITALS — BP 100/60 | HR 77 | Temp 98.4°F | Resp 16 | Ht 63.0 in | Wt 213.0 lb

## 2013-10-02 DIAGNOSIS — N899 Noninflammatory disorder of vagina, unspecified: Secondary | ICD-10-CM

## 2013-10-02 DIAGNOSIS — Z6837 Body mass index (BMI) 37.0-37.9, adult: Secondary | ICD-10-CM

## 2013-10-02 DIAGNOSIS — J029 Acute pharyngitis, unspecified: Secondary | ICD-10-CM

## 2013-10-02 DIAGNOSIS — N898 Other specified noninflammatory disorders of vagina: Secondary | ICD-10-CM

## 2013-10-02 LAB — POCT WET PREP WITH KOH
RBC Wet Prep HPF POC: NEGATIVE
Trichomonas, UA: NEGATIVE
Yeast Wet Prep HPF POC: NEGATIVE

## 2013-10-02 MED ORDER — FLUCONAZOLE 150 MG PO TABS: 150.0000 mg | ORAL_TABLET | Freq: Once | ORAL | Status: DC

## 2013-10-02 MED ORDER — ONDANSETRON HCL 4 MG PO TABS: 4.0000 mg | ORAL_TABLET | Freq: Three times a day (TID) | ORAL | Status: DC | PRN

## 2013-10-02 NOTE — Progress Notes (Signed)
This chart was scribed for Ethelda Chick, MD by Joaquin Music, ED Scribe. This patient was seen in room Room/bed info not found and the patient's care was started at 8:29 PM.  Subjective:    Patient ID: [name], female    DOB: 29-Nov-1980, 32 y.o.   MRN: [id number]  HPI [name] is a 32 y.o. female who presents to the [facility] complaining of ongoing sore throat and nausea that has been going on over the last few days. Pt reports sneezing, sore throat and being hoarse. She states she had an episode of emesis before leaving work today.  Pt states she was working over 60 hours last week and believes she is getting sick now that "she has time to sit down". Pt has been around the flu, croup, and strep while at work.  Pt also complains of vaginal irritation that has associated itching. Pt denies pain or discharge. Pt denies having trouble with urination. Pt denies having new sexual partners. This was present to a mild degree just before her period, disappeared during the period of over the last 5 days, and then reappeared over the last 2 days following her menses. Uses pads but nothing new. No history of herpes.  Review of Systems  HENT: Positive for sore throat.   Gastrointestinal: Positive for vomiting.  Genitourinary: Positive for vaginal discharge.  All other systems reviewed and are negative.     Objective:   Physical Exam  Constitutional: She appears well-developed.  HENT:  Head: Normocephalic.  Right Ear: External ear normal.  Left Ear: External ear normal.  Nose: Nose normal.  TMs clear. Posterior pharynx inflamed  without exudate.   Cardiovascular: Normal rate and normal heart sounds.   Pulmonary/Chest: Effort normal and breath sounds normal.  Genitourinary:  Labia inflamed. Slight vaginal discharge.   Lymphadenopathy:    She has no cervical adenopathy.   there are no discrete vaginal lesions  Triage Vitals:BP 100/60  Pulse 77  Temp(Src) 98.4 F (36.9 C) (Oral)   Resp 16  Ht 5\' 3"  (1.6 m)  Wt 213 lb (96.616 kg)  BMI 37.74 kg/m2  SpO2 99%  LMP 09/27/2013  Results for orders placed in visit on 10/02/13  POCT RAPID STREP A (OFFICE)      Result Value Range   Rapid Strep A Screen Negative  Negative  POCT WET PREP WITH KOH      Result Value Range   Trichomonas, UA Negative     Clue Cells Wet Prep HPF POC 0-4     Epithelial Wet Prep HPF POC 10-20     Yeast Wet Prep HPF POC neg     Bacteria Wet Prep HPF POC 3+     RBC Wet Prep HPF POC neg     WBC Wet Prep HPF POC 0-1     KOH Prep POC Negative     Assessment & Plan:  Acute pharyngitis - Plan: POCT rapid strep A, Culture, Group A Strep  Vaginal irritation - Plan: POCT Wet Prep with KOH  BMI 37.0-37.9, adult   The etiology of her complaints is uncertain/her vaginal irritation may be yeast despite the negative labs and so she will be treated with Diflucan, and followup if not resolved. Her other symptoms are very likely viral in nature and she is to consider herself contagious. Followup to reevaluate if not well in 5 days.

## 2013-10-03 DIAGNOSIS — Z6837 Body mass index (BMI) 37.0-37.9, adult: Secondary | ICD-10-CM | POA: Insufficient documentation

## 2013-10-05 ENCOUNTER — Telehealth: Payer: Self-pay

## 2013-10-05 LAB — CULTURE, GROUP A STREP: Organism ID, Bacteria: NORMAL

## 2013-10-05 NOTE — Telephone Encounter (Signed)
Patient is requesting another return to work note from being seen on Monday. She said she must have lost it at checkout when she left. Please advise  Best: [phone]

## 2013-10-05 NOTE — Telephone Encounter (Signed)
Printed. Called patient. It is at front desk.

## 2014-01-05 ENCOUNTER — Other Ambulatory Visit (HOSPITAL_COMMUNITY): Payer: Self-pay | Admitting: Family Medicine

## 2014-01-05 ENCOUNTER — Ambulatory Visit (HOSPITAL_COMMUNITY)
Admission: RE | Admit: 2014-01-05 | Discharge: 2014-01-05 | Disposition: A | Discharge status: Home / Self Care | Payer: Commercial Indemnity | Source: Ambulatory Visit | Attending: Internal Medicine | Admitting: Internal Medicine

## 2014-01-05 DIAGNOSIS — I82409 Acute embolism and thrombosis of unspecified deep veins of unspecified lower extremity: Secondary | ICD-10-CM

## 2014-01-05 DIAGNOSIS — I82509 Chronic embolism and thrombosis of unspecified deep veins of unspecified lower extremity: Secondary | ICD-10-CM

## 2014-01-05 DIAGNOSIS — M7989 Other specified soft tissue disorders: Secondary | ICD-10-CM | POA: Insufficient documentation

## 2014-01-05 NOTE — Progress Notes (Signed)
Left Upper Extremity Venous Duplex Completed. Negative for DVT and SVT. Brianna L Mazza,RVT

## 2014-05-21 ENCOUNTER — Other Ambulatory Visit: Payer: Self-pay | Admitting: Obstetrics & Gynecology

## 2014-05-22 LAB — CYTOLOGY - PAP

## 2014-06-20 ENCOUNTER — Inpatient Hospital Stay (HOSPITAL_COMMUNITY)
Admission: AD | Admit: 2014-06-20 | Payer: Commercial Indemnity | Source: Ambulatory Visit | Admitting: Obstetrics and Gynecology

## 2014-07-13 LAB — OB RESULTS CONSOLE RPR: RPR: NONREACTIVE

## 2014-07-13 LAB — OB RESULTS CONSOLE RUBELLA ANTIBODY, IGM: Rubella: IMMUNE

## 2014-07-13 LAB — OB RESULTS CONSOLE HIV ANTIBODY (ROUTINE TESTING): HIV: NONREACTIVE

## 2014-07-13 LAB — OB RESULTS CONSOLE HEPATITIS B SURFACE ANTIGEN: Hepatitis B Surface Ag: NEGATIVE

## 2014-11-22 ENCOUNTER — Encounter: Payer: Managed Care, Other (non HMO) | Attending: Obstetrics and Gynecology | Admitting: *Deleted

## 2014-11-22 ENCOUNTER — Ambulatory Visit (HOSPITAL_COMMUNITY)
Admission: RE | Admit: 2014-11-22 | Discharge: 2014-11-22 | Disposition: A | Discharge status: Home / Self Care | Payer: Managed Care, Other (non HMO) | Source: Ambulatory Visit | Attending: Obstetrics and Gynecology | Admitting: Obstetrics and Gynecology

## 2014-11-22 DIAGNOSIS — O24419 Gestational diabetes mellitus in pregnancy, unspecified control: Secondary | ICD-10-CM | POA: Insufficient documentation

## 2014-11-22 NOTE — Progress Notes (Signed)
  Patient was seen on 11/22/14 for Gestational Diabetes self-management .   The following learning objectives were met by the patient :   States the definition of Gestational Diabetes  States why dietary management is important in controlling blood glucose  Describes the effects of carbohydrates on blood glucose levels  Demonstrates ability to create a balanced meal plan  Demonstrates carbohydrate counting   States when to check blood glucose levels  Demonstrates proper blood glucose monitoring techniques  States the effect of stress and exercise on blood glucose levels  States the importance of limiting caffeine and abstaining from alcohol and smoking  Plan:  Aim for 2 Carb Choices per meal (30 grams) +/- 1 either way for breakfast Aim for 3 Carb Choices per meal (45 grams) +/- 1 either way from lunch and dinner Aim for 1-2 Carbs per snack Begin reading food labels for Total Carbohydrate and sugar grams of foods Consider  increasing your activity level by walking daily as tolerated Begin checking BG before breakfast and 2 hours after first bit of breakfast, lunch and dinner after  as directed by MD  Take medication  as directed by MD  Blood glucose monitor given: Accu-Chek Nano Lot # : [id number] Exp: 10/23/15 Blood glucose reading: $RemoveBeforeDE'137mg'[id number]$ /dl  Patient instructed to monitor glucose levels: FBS: 60 - <90 2 hour: <120  Patient received the following handouts:  Nutrition Diabetes and Pregnancy  Carbohydrate Counting List  Meal Planning worksheet  Patient will be seen for follow-up as needed.

## 2014-11-23 DIAGNOSIS — O24419 Gestational diabetes mellitus in pregnancy, unspecified control: Secondary | ICD-10-CM

## 2014-11-23 HISTORY — DX: Gestational diabetes mellitus in pregnancy, unspecified control: O24.419

## 2014-11-26 ENCOUNTER — Other Ambulatory Visit (HOSPITAL_COMMUNITY): Payer: Self-pay

## 2014-11-27 ENCOUNTER — Other Ambulatory Visit (HOSPITAL_COMMUNITY): Payer: Self-pay | Admitting: *Deleted

## 2014-11-29 ENCOUNTER — Inpatient Hospital Stay (HOSPITAL_COMMUNITY)
Admission: AD | Admit: 2014-11-29 | Discharge: 2014-11-30 | Disposition: A | Discharge status: Home / Self Care | Payer: Managed Care, Other (non HMO) | Source: Ambulatory Visit | Attending: Obstetrics and Gynecology | Admitting: Obstetrics and Gynecology

## 2014-11-29 ENCOUNTER — Encounter (HOSPITAL_COMMUNITY): Payer: Self-pay | Admitting: *Deleted

## 2014-11-29 ENCOUNTER — Ambulatory Visit (INDEPENDENT_AMBULATORY_CARE_PROVIDER_SITE_OTHER): Payer: Commercial Indemnity | Admitting: Cardiology

## 2014-11-29 ENCOUNTER — Inpatient Hospital Stay (HOSPITAL_COMMUNITY): Payer: Managed Care, Other (non HMO)

## 2014-11-29 ENCOUNTER — Encounter: Payer: Self-pay | Admitting: Cardiology

## 2014-11-29 VITALS — BP 140/80 | HR 85 | Ht 63.0 in | Wt 256.0 lb

## 2014-11-29 DIAGNOSIS — R079 Chest pain, unspecified: Secondary | ICD-10-CM | POA: Diagnosis present

## 2014-11-29 DIAGNOSIS — K219 Gastro-esophageal reflux disease without esophagitis: Secondary | ICD-10-CM | POA: Diagnosis not present

## 2014-11-29 DIAGNOSIS — Z3A31 31 weeks gestation of pregnancy: Secondary | ICD-10-CM | POA: Diagnosis not present

## 2014-11-29 DIAGNOSIS — O99613 Diseases of the digestive system complicating pregnancy, third trimester: Secondary | ICD-10-CM | POA: Insufficient documentation

## 2014-11-29 DIAGNOSIS — K449 Diaphragmatic hernia without obstruction or gangrene: Secondary | ICD-10-CM

## 2014-11-29 DIAGNOSIS — R0609 Other forms of dyspnea: Secondary | ICD-10-CM | POA: Insufficient documentation

## 2014-11-29 DIAGNOSIS — R7989 Other specified abnormal findings of blood chemistry: Secondary | ICD-10-CM

## 2014-11-29 LAB — CARDIAC PANEL
CK MB: 1.5 ng/mL (ref 0.3–4.0)
Relative Index: 3.8 calc — ABNORMAL HIGH (ref 0.0–2.5)
Total CK: 40 U/L (ref 7–177)

## 2014-11-29 LAB — D-DIMER, QUANTITATIVE: D-Dimer, Quant: 0.7 ug/mL-FEU — ABNORMAL HIGH (ref 0.00–0.48)

## 2014-11-29 MED ORDER — IOHEXOL 350 MG/ML SOLN: 100.0000 mL | Freq: Once | INTRAVENOUS | Status: AC | PRN

## 2014-11-29 NOTE — Progress Notes (Signed)
Follow up medication checks q 3 months

## 2014-11-29 NOTE — MAU Note (Signed)
Pt states she had an appointment with her cardiologist this morning and "some of the test came back saying that I might have a clot in my lungs "

## 2014-11-29 NOTE — Progress Notes (Signed)
[address] [place], [place]  [address] Phone: [phone] Fax:  [phone]  Date:  11/29/2014   ID:  [name], DOB [birth year], MRN [id number]  PCP:  Delorse Lek, MD  Cardiologist:  Armanda Magic, MD    History of Present Illness: [name] is a 34 y.o. female with type II DM and GERD and 31 week pregnancy  who presents today for evaluation of chest pain.  This has been off and on for the past few months as her abdomen has gotten bigger.  She describes it as a pressure in her chest that radiates into her neck but not into her arms.  It lasts constantly for about 30 minutes and then resolves.   She thought it might be anxiety related and it goes away with relaxation.  She has also noticed that she has DOE but also gets SOB with the chest tightness and she describes it as a feeling that she cannot get air in.  She has had some mild LE edema and is no on compression hose.  She has had some increased in heart rate but occasionally will have a skipped beat. She says that certain ways she lays the pressure is worse and it is worse lying down supine.     Wt Readings from Last 3 Encounters:  10/02/13 213 lb (96.616 kg)  04/16/13 195 lb (88.451 kg)  10/13/12 196 lb (88.905 kg)     Past Medical History  Diagnosis Date  . Allergy   . Arthritis   . Anxiety   . Asthma   . Depression   . GERD (gastroesophageal reflux disease)   . IBS (irritable bowel syndrome)   . Diabetes mellitus without complication     Current Outpatient Prescriptions  Medication Sig Dispense Refill  . cholecalciferol (VITAMIN D) 1000 UNITS tablet Take 1,000 Units by mouth daily.    . clonazePAM (KLONOPIN) 0.5 MG tablet Take 0.5 mg by mouth 2 (two) times daily as needed for anxiety.    . clorazepate (TRANXENE) 7.5 MG tablet Take 7.5 mg by mouth 2 (two) times daily as needed.    . divalproex (DEPAKOTE) 250 MG DR tablet Take 250 mg by mouth 3 (three) times daily.    . fesoterodine (TOVIAZ) 8 MG TB24 tablet  Take 8 mg by mouth daily.    . fluconazole (DIFLUCAN) 150 MG tablet Take 1 tablet (150 mg total) by mouth once. 1 tablet 0  . FLUoxetine (PROZAC) 20 MG capsule Take 20 mg by mouth daily.    . hydrocortisone (ANUSOL-HC) 25 MG suppository Place 1 suppository (25 mg total) rectally 2 (two) times daily as needed for hemorrhoids. 12 suppository 0  . Levomilnacipran HCl ER (FETZIMA) 80 MG CP24 Take 80 mg by mouth daily.    . Lidocaine, Anorectal, 5 % CREA Apply 1 application topically 3 (three) times daily as needed. 30 g 1  . Linaclotide (LINZESS) 145 MCG CAPS Take 145 mcg by mouth daily.    Marland Kitchen loratadine (CLARITIN) 10 MG tablet Take 10 mg by mouth daily.    . Multiple Vitamin (MULTIVITAMIN) tablet Take 1 tablet by mouth daily.    . Norethindrone-Ethinyl Estradiol-Fe Biphas (LO LOESTRIN FE) 1 MG-10 MCG / 10 MCG tablet Take 1 tablet by mouth daily.    Marland Kitchen omeprazole (PRILOSEC) 20 MG capsule Take 20 mg by mouth daily.    . ondansetron (ZOFRAN) 4 MG tablet Take 1 tablet (4 mg total) by mouth every 8 (eight) hours as  needed for nausea or vomiting. 8 tablet 0  . phentermine 37.5 MG capsule Take 37.5 mg by mouth every morning.    . Probiotic Product (ALIGN PO) Take by mouth daily.     No current facility-administered medications for this visit.    Allergies:    Allergies  Allergen Reactions  . Codeine Nausea And Vomiting  . Levaquin [Levofloxacin In D5w]   . Seroquel [Quetiapine Fumarate]   . Vicodin [Hydrocodone-Acetaminophen] Nausea And Vomiting  . Wellbutrin [Bupropion] Other (See Comments)    Seizures    Social History:  The patient  reports that she has never smoked. She does not have any smokeless tobacco history on file. She reports that she does not drink alcohol or use illicit drugs.   Family History:  The patient's family history includes Arthritis in her mother; Depression in her father; Diabetes in her father and mother; Heart disease in her father; Hypertension in her mother.   ROS:   Please see the history of present illness.      All other systems reviewed and negative.   PHYSICAL EXAM: VS:  There were no vitals taken for this visit. Well nourished, well developed, in no acute distress HEENT: normal Neck: no JVD Cardiac:  normal S1, S2; RRR; no murmur Lungs:  clear to auscultation bilaterally, no wheezing, rhonchi or rales Abd: soft, nontender, no hepatomegaly Ext: no edema Skin: warm and dry Neuro:  CNs 2-12 intact, no focal abnormalities noted  EKG:  NSR with nonspecific ST abnormality     ASSESSMENT AND PLAN:  1. Atypical chest pain that seems to have occurred since her abdomen has gotten bigger.  It is also positional and worse when she lays flat raising the question as to whether it could be related to GERD.  EKG shows nonspecific T wave abnormality.  I will check a 2 D echo to assess LVF and rule out pericardial effusion.  Check CPK/MB/Troponin and D-Dimer 2. DOE most likely related to pregnancy 3. 31 week pregnancy 4. GERD 5. Type II DM  Followup with me in 4 weeks  Signed, Armanda Magic, MD [facility] 11/29/2014 8:52 AM

## 2014-11-29 NOTE — Progress Notes (Signed)
Pt states she is feeling some dizziness but no changes in vision

## 2014-11-29 NOTE — Patient Instructions (Addendum)
Your physician recommends that you have lab work TODAY (troponin, CKMB, d-dimer)  Your physician has requested that you have an echocardiogram. Echocardiography is a painless test that uses sound waves to create images of your heart. It provides your doctor with information about the size and shape of your heart and how well your heart's chambers and valves are working. This procedure takes approximately one hour. There are no restrictions for this procedure.  Your physician recommends that you schedule a follow-up appointment in: 4 weeks with Dr. Mayford Knifeurner.

## 2014-11-30 ENCOUNTER — Encounter: Payer: Self-pay | Admitting: Cardiology

## 2014-11-30 ENCOUNTER — Encounter (HOSPITAL_COMMUNITY): Payer: Self-pay

## 2014-11-30 DIAGNOSIS — K219 Gastro-esophageal reflux disease without esophagitis: Secondary | ICD-10-CM

## 2014-11-30 DIAGNOSIS — O99613 Diseases of the digestive system complicating pregnancy, third trimester: Secondary | ICD-10-CM

## 2014-11-30 NOTE — MAU Provider Note (Signed)
History     CSN: [id number]  Arrival date and time: 11/29/14 2128   First Provider Initiated Contact with Patient 11/29/14 2316      No chief complaint on file. CC: Chest pain HPI [name] 34 y.o. G1P0 @31  weeks presents to MAU for a CT scan.  Earlier on 11/29/14, she was seen by cardiology for chest pain.  She had labs drawn and an elevated D-dimer is reported.   She endorses appropriate fetal movement and denies ctx, vag bleeding, LOF.    OB History    Gravida Para Term Preterm AB TAB SAB Ectopic Multiple Living   1               Past Medical History  Diagnosis Date  . Allergy   . Arthritis   . Anxiety   . Asthma   . Depression   . GERD (gastroesophageal reflux disease)   . IBS (irritable bowel syndrome)   . Diabetes mellitus without complication     Past Surgical History  Procedure Laterality Date  . Cholecystectomy    . Ganglion cyst excision    . Appendectomy      Family History  Problem Relation Age of Onset  . Diabetes Mother   . Hypertension Mother   . Arthritis Mother   . Diabetes Father   . Heart disease Father   . Depression Father     History  Substance Use Topics  . Smoking status: Never Smoker   . Smokeless tobacco: Not on file  . Alcohol Use: No    Allergies:  Allergies  Allergen Reactions  . Codeine Nausea And Vomiting  . Levaquin [Levofloxacin In D5w] Nausea And Vomiting  . Seroquel [Quetiapine Fumarate] Other (See Comments)    "extremely sedative"  . Vicodin [Hydrocodone-Acetaminophen] Nausea And Vomiting  . Wellbutrin [Bupropion] Other (See Comments)    Seizures    Prescriptions prior to admission  Medication Sig Dispense Refill Last Dose  . acetaminophen (TYLENOL) 500 MG tablet Take 1,000 mg by mouth every 6 (six) hours as needed for moderate pain.   Past Month at Unknown time  . calcium carbonate (TUMS - DOSED IN MG ELEMENTAL CALCIUM) 500 MG chewable tablet Chew 2 tablets by mouth 3 (three) times daily as needed for  indigestion or heartburn.   11/28/2014 at Unknown time  . loratadine (CLARITIN) 10 MG tablet Take 10 mg by mouth daily as needed for allergies.    Past Month at Unknown time  . Omega-3 Fatty Acids (FISH OIL) 1200 MG CAPS Take 1 capsule by mouth daily.   11/29/2014 at Unknown time  . omeprazole (PRILOSEC) 20 MG capsule Take 20 mg by mouth daily.   11/29/2014 at Unknown time  . Polyethyl Glycol-Propyl Glycol (SYSTANE OP) Apply 1 drop to eye 2 (two) times daily.   11/29/2014 at Unknown time  . Prenat w/o A-FeCbGl-DSS-FA-DHA (CITRANATAL DHA) 27-1 & 250 MG tablet Take 1 tablet by mouth daily.   11/29/2014 at Unknown time  . sertraline (ZOLOFT) 50 MG tablet Take 50 mg by mouth daily.  1 11/29/2014 at Unknown time  . triamcinolone (NASACORT) 55 MCG/ACT AERO nasal inhaler Place 2 sprays into the nose daily.   Past Week at Unknown time  . clonazePAM (KLONOPIN) 0.5 MG tablet Take 0.5 mg by mouth 2 (two) times daily as needed for anxiety.   more than one month  . clorazepate (TRANXENE) 7.5 MG tablet Take 7.5 mg by mouth 2 (two) times daily as needed for anxiety.  more than one month  . ondansetron (ZOFRAN) 4 MG tablet Take 1 tablet (4 mg total) by mouth every 8 (eight) hours as needed for nausea or vomiting. 8 tablet 0 more than one month    ROS Pertinent ROS in HPI Physical Exam   Blood pressure 92/42, pulse 87, temperature 98.3 F (36.8 C), temperature source Oral, resp. rate 18, height  (1.6 m), weight 255 lb 12.8 oz (116.03 kg), SpO2 98 %.  Physical Exam  Constitutional: She is oriented to person, place, and time. She appears well-developed and well-nourished. No distress.  Eyes: EOM are normal.  Neck: Normal range of motion.  Cardiovascular: Normal rate.   Respiratory: Effort normal. No respiratory distress.  Musculoskeletal: Normal range of motion.  Neurological: She is alert and oriented to person, place, and time.  Skin: Skin is warm and dry.  Psychiatric: She has a normal mood and affect.    Fetal tracing is reactive.    MAU Course  Procedures  MDM Dr. Fredia Sorrow called to give orders for CT angiogram to eval for PE.  Report given to Dr. Tenny Craw who authorizes for discharge of pt to home with advice to increase Omeprazole to .     Assessment and Plan  A:  1. Gastroesophageal reflux in pregancy in third trimester   2. D-dimer, elevated   3. Hiatal hernia    P: Discharge to home Increase Omeprazole to  Follow up in clinic as scheduled on Monday Patient may return to MAU as needed or if her condition were to change or worsen   Bertram Denver 11/30/2014, 12:49 AM

## 2014-11-30 NOTE — Discharge Instructions (Signed)
Increase Omeprazole to 40mg   Hiatal Hernia A hiatal hernia occurs when part of your stomach slides above the muscle that separates your abdomen from your chest (diaphragm). You can be born with a hiatal hernia (congenital), or it may develop over time. In almost all cases of hiatal hernia, only the top part of the stomach pushes through.  Many people have a hiatal hernia with no symptoms. The larger the hernia, the more likely that you will have symptoms. In some cases, a hiatal hernia allows stomach acid to flow back into the tube that carries food from your mouth to your stomach (esophagus). This may cause heartburn symptoms. Severe heartburn symptoms may mean you have developed a condition called gastroesophageal reflux disease (GERD).  CAUSES  Hiatal hernias are caused by a weakness in the opening (hiatus) where your esophagus passes through your diaphragm to attach to the upper part of your stomach. You may be born with a weakness in your hiatus, or a weakness can develop. RISK FACTORS Older age is a major risk factor for a hiatal hernia. Anything that increases pressure on your diaphragm can also increase your risk of a hiatal hernia. This includes:  Pregnancy.  Excess weight.  Frequent constipation. SIGNS AND SYMPTOMS  People with a hiatal hernia often have no symptoms. If symptoms develop, they are almost always caused by GERD. They may include:  Heartburn.  Belching.  Indigestion.  Trouble swallowing.  Coughing or wheezing.  Sore throat.  Hoarseness.  Chest pain. DIAGNOSIS  A hiatal hernia is sometimes found during an exam for another problem. Your health care provider may suspect a hiatal hernia if you have symptoms of GERD. Tests may be done to diagnose GERD. These may include:  X-rays of your stomach or chest.  An upper gastrointestinal (GI) series. This is an X-ray exam of your GI tract involving the use of a chalky liquid that you swallow. The liquid shows up  clearly on the X-ray.  Endoscopy. This is a procedure to look into your stomach using a thin, flexible tube that has a tiny camera and light on the end of it. TREATMENT  If you have no symptoms, you may not need treatment. If you have symptoms, treatment may include:  Dietary and lifestyle changes to help reduce GERD symptoms.  Medicines. These may include:  Over-the-counter antacids.  Medicines that make your stomach empty more quickly.  Medicines that block the production of stomach acid (H2 blockers).  Stronger medicines to reduce stomach acid (proton pump inhibitors).  You may need surgery to repair the hernia if other treatments are not helping. HOME CARE INSTRUCTIONS   Take all medicines as directed by your health care provider.  Quit smoking, if you smoke.  Try to achieve and maintain a healthy body weight.  Eat frequent small meals instead of three large meals a day. This keeps your stomach from getting too full.  Eat slowly.  Do not lie down right after eating.  Do noteat 1-2 hours before bed.   Do not drink beverages with caffeine. These include cola, coffee, cocoa, and tea.  Do not drink alcohol.  Avoid foods that can make symptoms of GERD worse. These may include:  Fatty foods.  Citrus fruits.  Other foods and drinks that contain acid.  Avoid putting pressure on your belly. Anything that puts pressure on your belly increases the amount of acid that may be pushed up into your esophagus.   Avoid bending over, especially after eating.  Raise  the head of your bed by putting blocks under the legs. This keeps your head and esophagus higher than your stomach.  Do not wear tight clothing around your chest or stomach.  Try not to strain when having a bowel movement, when urinating, or when lifting heavy objects. SEEK MEDICAL CARE IF:  Your symptoms are not controlled with medicines or lifestyle changes.  You are having trouble swallowing.  You have  coughing or wheezing that will not go away. SEEK IMMEDIATE MEDICAL CARE IF:  Your pain is getting worse.  Your pain spreads to your arms, neck, jaw, teeth, or back.  You have shortness of breath.  You sweat for no reason.  You feel sick to your stomach (nauseous) or vomit.  You vomit blood.  You have bright red blood in your stools.  You have black, tarry stools.  Document Released: 01/30/2004 Document Revised: 03/26/2014 Document Reviewed: 10/27/2013 [facility] Patient Information 2015 [place], [place]. This information is not intended to replace advice given to you by your health care provider. Make sure you discuss any questions you have with your health care provider.

## 2014-12-05 ENCOUNTER — Ambulatory Visit (HOSPITAL_COMMUNITY): Payer: Managed Care, Other (non HMO) | Attending: Cardiology | Admitting: Cardiology

## 2014-12-05 DIAGNOSIS — R079 Chest pain, unspecified: Secondary | ICD-10-CM | POA: Diagnosis not present

## 2014-12-05 DIAGNOSIS — E119 Type 2 diabetes mellitus without complications: Secondary | ICD-10-CM | POA: Insufficient documentation

## 2014-12-05 NOTE — Progress Notes (Signed)
Echo performed. 

## 2014-12-24 ENCOUNTER — Ambulatory Visit (INDEPENDENT_AMBULATORY_CARE_PROVIDER_SITE_OTHER): Payer: Self-pay | Admitting: Pediatrics

## 2014-12-24 DIAGNOSIS — K219 Gastro-esophageal reflux disease without esophagitis: Secondary | ICD-10-CM | POA: Insufficient documentation

## 2014-12-24 DIAGNOSIS — Z7681 Expectant parent(s) prebirth pediatrician visit: Secondary | ICD-10-CM

## 2014-12-24 NOTE — Progress Notes (Signed)
Met with expectant parents, about [redacted] weeks pregnant and expecting BOY on 29 January 2015 Discussed details of practice hours, access during and after hours, vaccine philosophy and policy Answered parent's questions Discussed feeding (breast feeding, formula feeding options)

## 2014-12-24 NOTE — Progress Notes (Signed)
This encounter was created in error - please disregard.

## 2014-12-26 ENCOUNTER — Encounter: Payer: Managed Care, Other (non HMO) | Admitting: Cardiology

## 2014-12-27 ENCOUNTER — Other Ambulatory Visit: Payer: Self-pay | Admitting: Obstetrics and Gynecology

## 2014-12-28 ENCOUNTER — Ambulatory Visit (INDEPENDENT_AMBULATORY_CARE_PROVIDER_SITE_OTHER): Payer: Managed Care, Other (non HMO) | Admitting: Cardiology

## 2014-12-28 ENCOUNTER — Encounter: Payer: Self-pay | Admitting: Cardiology

## 2014-12-28 VITALS — BP 140/82 | HR 92 | Ht 62.0 in | Wt 261.8 lb

## 2014-12-28 DIAGNOSIS — K219 Gastro-esophageal reflux disease without esophagitis: Secondary | ICD-10-CM

## 2014-12-28 DIAGNOSIS — R0609 Other forms of dyspnea: Secondary | ICD-10-CM

## 2014-12-28 DIAGNOSIS — R079 Chest pain, unspecified: Secondary | ICD-10-CM

## 2014-12-28 NOTE — Progress Notes (Signed)
Cardiology Office Note   Date:  12/28/2014   ID:  Jessica AdjutantRobyn Obrien, DOB 01/10/1981, MRN [id number]  PCP:  Delorse LekBURNETT,BRENT A, MD  Cardiologist:   Quintella ReichertURNER,TRACI R, MD   Chief Complaint  Patient presents with  . Chest Pain      History of Present Illness: [name] is a 34 y.o. female with type II DM and GERD and 31 week pregnancy who presents today for followup of chest pain. This has been off and on for the past few months as her abdomen has gotten bigger. She describes it as a pressure in her chest that radiates into her neck but not into her arms. It lasts constantly for about 30 minutes and then resolves. She thought it might be anxiety related and it goes away with relaxation. She has also noticed that she has DOE but also gets SOB with the chest tightness and she describes it as a feeling that she cannot get air in. She has had some mild LE edema and is no on compression hose. She has had some increased in heart rate but occasionally will have a skipped beat. She says that certain ways she lays the pressure is worse and it is worse lying down supine. Cardiac enzymes were checked and were normal but Troponin was elevate.  This was discussed with her OB GYN and it was decided to proceed with Chest CT angio to rule out PE which showed no evidence of PE but did show a small hiatal hernia.  Her 2D echo was normal.  She now presents back today for followup.  Her CP has significantly improved on Zantac.  She is still SOB but 2D echo showed normal LVF.  She has some trace LE edema and is wearing compression hose.      Past Medical History  Diagnosis Date  . Allergy   . Arthritis   . Anxiety   . Asthma   . Depression   . GERD (gastroesophageal reflux disease)   . IBS (irritable bowel syndrome)   . Diabetes mellitus without complication     Past Surgical History  Procedure Laterality Date  . Cholecystectomy    . Ganglion cyst excision    . Appendectomy       Current Outpatient  Prescriptions  Medication Sig Dispense Refill  . ACCU-CHEK FASTCLIX LANCETS MISC     . ACCU-CHEK SMARTVIEW test strip   2  . acetaminophen (TYLENOL) 500 MG tablet Take 1,000 mg by mouth every 6 (six) hours as needed for moderate pain.    . calcium carbonate (TUMS - DOSED IN MG ELEMENTAL CALCIUM) 500 MG chewable tablet Chew 2 tablets by mouth 3 (three) times daily as needed for indigestion or heartburn.    . ferrous fumarate (HEMOCYTE - 106 MG FE) 325 (106 FE) MG TABS tablet Take 1 tablet by mouth daily.    Marland Kitchen. glyBURIDE (DIABETA) 2.5 MG tablet Take 2.5 mg by mouth 2 (two) times daily.    Marland Kitchen. loratadine (CLARITIN) 10 MG tablet Take 10 mg by mouth daily as needed for allergies.     . Omega-3 Fatty Acids (FISH OIL) 1200 MG CAPS Take 1 capsule by mouth daily.    Marland Kitchen. omeprazole (PRILOSEC) 40 MG capsule Take 40 mg by mouth daily.  1  . ondansetron (ZOFRAN) 4 MG tablet Take 1 tablet (4 mg total) by mouth every 8 (eight) hours as needed for nausea or vomiting. 8 tablet 0  . Polyethyl Glycol-Propyl Glycol (SYSTANE OP) Apply 1 drop  to eye 2 (two) times daily.    . Prenat w/o A-FeCbGl-DSS-FA-DHA (CITRANATAL DHA) 27-1 & 250 MG tablet Take 1 tablet by mouth daily.    . Prenat-FeFmCb-DSS-FA-DHA w/o A (CITRANATAL HARMONY) 27-1-260 MG CAPS Take 1 capsule by mouth daily.  11  . pyridOXINE (VITAMIN B-6) 100 MG tablet Take 100 mg by mouth daily.    . ranitidine (ZANTAC) 150 MG tablet Take 150 mg by mouth daily.  0  . sertraline (ZOLOFT) 50 MG tablet Take 50 mg by mouth daily.  1  . triamcinolone (NASACORT) 55 MCG/ACT AERO nasal inhaler Place 2 sprays into the nose daily.     No current facility-administered medications for this visit.    Allergies:   Codeine; Levaquin; Seroquel; Vicodin; and Wellbutrin    Social History:  The patient  reports that she has never smoked. She does not have any smokeless tobacco history on file. She reports that she does not drink alcohol or use illicit drugs.   Family History:  The  patient's family history includes Arthritis in her mother; Depression in her father; Diabetes in her father and mother; Heart disease in her father; Hypertension in her mother.    ROS:  Please see the history of present illness.   Otherwise, review of systems are positive for none.   All other systems are reviewed and negative.    PHYSICAL EXAM: VS:  BP 140/82 mmHg  Pulse 92  Ht  (1.575 m)  Wt 261 lb 12.8 oz (118.752 kg)  BMI 47.87 kg/m2  SpO2 98% , BMI Body mass index is 47.87 kg/(m^2). GEN: Well nourished, well developed, in no acute distress HEENT: normal Neck: no JVD, carotid bruits, or masses Cardiac: RRR; no murmurs, rubs, or gallops,no edema  Respiratory:  clear to auscultation bilaterally, normal work of breathing GI: soft, nontender, nondistended, + BS MS: no deformity or atrophy Skin: warm and dry, no rash Neuro:  Strength and sensation are intact Psych: euthymic mood, full affect   EKG:  EKG is not ordered today.    Recent Labs: No results found for requested labs within last 365 days.    Lipid Panel No results found for: CHOL, TRIG, HDL, CHOLHDL, VLDL, LDLCALC, LDLDIRECT    Wt Readings from Last 3 Encounters:  12/28/14 261 lb 12.8 oz (118.752 kg)  11/29/14 256 lb (116.121 kg)  10/02/13 213 lb (96.616 kg)      Other studies Reviewed: Additional studies/ records that were reviewed today include: Chest CT, 2D echo. Review of the above records demonstrates: no PE and normal LVF   ASSESSMENT AND PLAN:  1. Atypical chest pain that seems to have occurred since her abdomen has gotten bigger. It is also positional and worse when she lays flat raising the question as to whether it could be related to GERD. EKG shows nonspecific T wave abnormality.2 D echo was normal ut D-Dimer was elevated and patient had CHest CT angio showing no PE but a small hiatal hernia.  CP is most likely due to GI etiology and has actually improved after starting Zantac.    2. DOE most likely related to pregnancy 3. 35 week pregnancy 4. GERD 5. Type II DM  Current medicines are reviewed at length with the patient today.  The patient does not have concerns regarding medicines.  The following changes have been made:  no change  Labs/ tests ordered today include: None     Disposition:   FU with me 4 weeks in 8 weeks  Harlon Flor, MD  12/28/2014 8:23 AM    [facility] [address] [place], [place], [place]  [address] Phone: [phone]; Fax: [phone]

## 2014-12-28 NOTE — Patient Instructions (Signed)
Your physician recommends that you continue on your current medications as directed. Please refer to the Current Medication list given to you today.  Your physician recommends that you schedule a follow-up appointment in 8 weeks with Dr. Mayford Knifeurner.

## 2014-12-31 LAB — OB RESULTS CONSOLE GBS: GBS: NEGATIVE

## 2015-01-10 ENCOUNTER — Ambulatory Visit: Payer: Managed Care, Other (non HMO) | Attending: Obstetrics and Gynecology | Admitting: Physical Therapy

## 2015-01-10 DIAGNOSIS — M25551 Pain in right hip: Secondary | ICD-10-CM | POA: Diagnosis not present

## 2015-01-10 DIAGNOSIS — O24913 Unspecified diabetes mellitus in pregnancy, third trimester: Secondary | ICD-10-CM | POA: Insufficient documentation

## 2015-01-10 DIAGNOSIS — M545 Low back pain: Secondary | ICD-10-CM | POA: Insufficient documentation

## 2015-01-10 DIAGNOSIS — O26893 Other specified pregnancy related conditions, third trimester: Secondary | ICD-10-CM | POA: Diagnosis not present

## 2015-01-10 DIAGNOSIS — R262 Difficulty in walking, not elsewhere classified: Secondary | ICD-10-CM

## 2015-01-10 DIAGNOSIS — M25559 Pain in unspecified hip: Secondary | ICD-10-CM

## 2015-01-10 DIAGNOSIS — K219 Gastro-esophageal reflux disease without esophagitis: Secondary | ICD-10-CM | POA: Diagnosis not present

## 2015-01-10 NOTE — Patient Instructions (Signed)
PELVIC STABILIZATION: Pelvic Tilt (Lying)   Exhaling, pull belly toward spine, tilting pelvis forward. Inhaling, release. Repeat ___ times. Do ___ times per day.  Copyright  VHI. All rights reserved.       Abdominal Bracing With Pelvic Floor (Hook-Lying)   With neutral spine, tighten pelvic floor and abdominals. Repeat ___ times. Do ___ times a day.   Copyright  VHI. All rights reserved.  Back Pain in Pregnancy Back pain during pregnancy is common. It happens in about half of all pregnancies. It is important for you and your baby that you remain active during your pregnancy.If you feel that back pain is not allowing you to remain active or sleep well, it is time to see your caregiver. Back pain may be caused by several factors related to changes during your pregnancy.Fortunately, unless you had trouble with your back before your pregnancy, the pain is likely to get better after you deliver. Low back pain usually occurs between the fifth and seventh months of pregnancy. It can, however, happen in the first couple months. Factors that increase the risk of back problems include:   Previous back problems.  Injury to your back.  Having twins or multiple births.  A chronic cough.  Stress.  Job-related repetitive motions.  Muscle or spinal disease in the back.  Family history of back problems, ruptured (herniated) discs, or osteoporosis.  Depression, anxiety, and panic attacks. CAUSES   When you are pregnant, your body produces a hormone called relaxin. This hormonemakes the ligaments connecting the low back and pubic bones more flexible. This flexibility allows the baby to be delivered more easily. When your ligaments are loose, your muscles need to work harder to support your back. Soreness in your back can come from tired muscles. Soreness can also come from back tissues that are irritated since they are receiving less support.  As the baby grows, it puts pressure  on the nerves and blood vessels in your pelvis. This can cause back pain.  As the baby grows and gets heavier during pregnancy, the uterus pushes the stomach muscles forward and changes your center of gravity. This makes your back muscles work harder to maintain good posture. SYMPTOMS  Lumbar pain during pregnancy Lumbar pain during pregnancy usually occurs at or above the waist in the center of the back. There may be pain and numbness that radiates into your leg or foot. This is similar to low back pain experienced by non-pregnant women. It usually increases with sitting for long periods of time, standing, or repetitive lifting. Tenderness may also be present in the muscles along your upper back. Posterior pelvic pain during pregnancy Pain in the back of the pelvis is more common than lumbar pain in pregnancy. It is a deep pain felt in your side at the waistline, or across the tailbone (sacrum), or in both places. You may have pain on one or both sides. This pain can also go into the buttocks and backs of the upper thighs. Pubic and groin pain may also be present. The pain does not quickly resolve with rest, and morning stiffness may also be present. Pelvic pain during pregnancy can be brought on by most activities. A high level of fitness before and during pregnancy may or may not prevent this problem. Labor pain is usually 1 to 2 minutes apart, lasts for about 1 minute, and involves a bearing down feeling or pressure in your pelvis. However, if you are at term with the pregnancy, constant  low back pain can be the beginning of early labor, and you should be aware of this. DIAGNOSIS  X-rays of the back should not be done during the first 12 to 14 weeks of the pregnancy and only when absolutely necessary during the rest of the pregnancy. MRIs do not give off radiation and are safe during pregnancy. MRIs also should only be done when absolutely necessary. HOME CARE INSTRUCTIONS  Exercise as directed by  your caregiver. Exercise is the most effective way to prevent or manage back pain. If you have a back problem, it is especially important to avoid sports that require sudden body movements. Swimming and walking are great activities.  Do not stand in one place for long periods of time.  Do not wear high heels.  Sit in chairs with good posture. Use a pillow on your lower back if necessary. Make sure your head rests over your shoulders and is not hanging forward.  Try sleeping on your side, preferably the left side, with a pillow or two between your legs. If you are sore after a night's rest, your bedmay betoo soft.Try placing a board between your mattress and box spring.  Listen to your body when lifting.If you are experiencing pain, ask for help or try bending yourknees more so you can use your leg muscles rather than your back muscles. Squat down when picking up something from the floor. Do not bend over.  Eat a healthy diet. Try to gain weight within your caregiver's recommendations.  Use heat or cold packs 3 to 4 times a day for 15 minutes to help with the pain.  Only take over-the-counter or prescription medicines for pain, discomfort, or fever as directed by your caregiver. Sudden (acute) back pain  Use bed rest for only the most extreme, acute episodes of back pain. Prolonged bed rest over 48 hours will aggravate your condition.  Ice is very effective for acute conditions.  Put ice in a plastic bag.  Place a towel between your skin and the bag.  Leave the ice on for 10 to 20 minutes every 2 hours, or as needed.  Using heat packs for 30 minutes prior to activities is also helpful. Continued back pain See your caregiver if you have continued problems. Your caregiver can help or refer you for appropriate physical therapy. With conditioning, most back problems can be avoided. Sometimes, a more serious issue may be the cause of back pain. You should be seen right away if new  problems seem to be developing. Your caregiver may recommend:  A maternity girdle.  An elastic sling.  A back brace.  A massage therapist or acupuncture. SEEK MEDICAL CARE IF:   You are not able to do most of your daily activities, even when taking the pain medicine you were given.  You need a referral to a physical therapist or chiropractor.  You want to try acupuncture. SEEK IMMEDIATE MEDICAL CARE IF:  You develop numbness, tingling, weakness, or problems with the use of your arms or legs.  You develop severe back pain that is no longer relieved with medicines.  You have a sudden change in bowel or bladder control.  You have increasing pain in other areas of the body.  You develop shortness of breath, dizziness, or fainting.  You develop nausea, vomiting, or sweating.  You have back pain which is similar to labor pains.  You have back pain along with your water breaking or vaginal bleeding.  You have back pain or  numbness that travels down your leg.  Your back pain developed after you fell.  You develop pain on one side of your back. You may have a kidney stone.  You see blood in your urine. You may have a bladder infection or kidney stone.  You have back pain with blisters. You may have shingles. Back pain is fairly common during pregnancy but should not be accepted as just part of the process. Back pain should always be treated as soon as possible. This will make your pregnancy as pleasant as possible. Document Released: 02/17/2006 Document Revised: 02/01/2012 Document Reviewed: 03/31/2011 [facility] Patient Information 2015 [place], [place]. This information is not intended to replace advice given to you by your health care provider. Make sure you discuss any questions you have with your health care provider.

## 2015-01-11 NOTE — Therapy (Signed)
[facility] [address] [place], [place], [address] Phone: [phone]   Fax:  [phone]  Physical Therapy Evaluation  Patient Details  Name: [name] MRN: [id number] Date of Birth: [birth year] Referring Provider:  Almon Hercules., MD  Encounter Date: 01/10/2015      PT End of Session - 01/10/15 1834    Visit Number 1   Number of Visits 8   Date for PT Re-Evaluation 02/07/15   PT Start Time 1548   PT Stop Time 1635   PT Time Calculation (min) 47 min   Activity Tolerance Patient limited by pain   Behavior During Therapy [facility] for tasks assessed/performed      Past Medical History  Diagnosis Date  . Allergy   . Arthritis   . Anxiety   . Asthma   . Depression   . GERD (gastroesophageal reflux disease)   . IBS (irritable bowel syndrome)   . Diabetes mellitus without complication     Past Surgical History  Procedure Laterality Date  . Cholecystectomy    . Ganglion cyst excision    . Appendectomy      There were no vitals taken for this visit.  Visit Diagnosis:  Pregnancy related hip pain, antepartum, third trimester  Walking difficulty due to pelvic region and thigh      Subjective Assessment - 01/10/15 1555    Symptoms Pt reports onset of low back pain 5 mos into pregnancy then in Dec. Rt. hip pain began.  Pain in Rt.. hip groin, glute, and lateral thigh (ITB). She is very uncomfortable and simple daily activities are causing severe pain and limitations in mobility.    Pertinent History history of bursitis, due date 01/29/15, diabetes, hiatal hernia, GERD   Limitations Sitting;Standing;Lifting;Walking;House hold activities;Other (comment)  transfers, stairs, unable to work   How long can you sit comfortably? not at all   How long can you stand comfortably? not at all   How long can you walk comfortably? not at all   Diagnostic tests CT scan for chest pain   Patient Stated Goals comfort, be able to have a  natural, mobile birth   Currently in Pain? Yes   Pain Score 10-Worst pain ever   Pain Location Hip   Pain Orientation Right;Anterior;Posterior;Lateral   Pain Descriptors / Indicators Burning;Aching;Pounding;Heaviness   Pain Type Chronic pain   Pain Onset More than a month ago   Pain Frequency Constant   Aggravating Factors  any movement, transitions, walking   Pain Relieving Factors pillows between knees, rest (takes time)   Multiple Pain Sites No          [facility] PT Assessment - 01/10/15 1606    Assessment   Medical Diagnosis Rt. hip pain during preg   Onset Date 11/06/14   Next MD Visit 2 x every week   Prior Therapy Rt. hip prior    Precautions   Precautions Other (comment)   Precaution Comments Pregnancy   Restrictions   Weight Bearing Restrictions No   Balance Screen   Has the patient fallen in the past 6 months No   Prior Function   Vocation Full time employment   Vocation Requirements [employer]   Cognition   Overall Cognitive Status Within Functional Limits for tasks assessed   Observation/Other Assessments   Observations decreased WB on Rt. LE, increased effort with sit to stand, gait, bed mobility.    Posture/Postural Control   Posture/Postural Control Postural limitations   Postural  Limitations Forward head;Increased lumbar lordosis;Anterior pelvic tilt   Posture Comments rt.iiac crest higher in standing, level pelvis in supine   Flexibility   Soft Tissue Assessment /Muscle Length --  hamstrings tight to appox 50 deg bilat.    Palpation   Palpation pain with palpation to lateral Rt hip greater trch and mid ITB. Pain Rt ASIS.  Min soreness in Rt. glute medius           PT Education - 01/10/15 1833    Education provided Yes   Education Details PT/POC, precautions of modalities, positioning, tennis ball for relief of glute pain, Pelvic floor, HEP   Person(s) Educated Patient   Methods Explanation;Demonstration;Handout   Comprehension Verbalized  understanding;Returned demonstration;Need further instruction             PT Long Term Goals - 01/11/15 0807    PT LONG TERM GOAL #1   Title Pt will be I with basic HEP for gentle stretching, positioning for comfort   Time 4   Period Weeks   Status New   PT LONG TERM GOAL #2   Title Pt will be able to reduce intensity of pain to less than 6/10 with ADLs, household mobility   Time 4   Period Weeks   Status New   PT LONG TERM GOAL #3   Title Pt will report less difficulty with finding rest position at night   Time 4   Period Weeks   Status New               Plan - 01/10/15 1835    Clinical Impression Statement This patient presents with symptoms consistent with SIJ dysfunction vs lumbar radiculopathy.  She has pain in  Rt. low back/hip with Lt. LE motion, however her history of lumbar pain could indicate this as the source.  We will treat her symptoms and try to make her more comfortable in the last weeks of her pregnancy     Pt will benefit from skilled therapeutic intervention in order to improve on the following deficits Abnormal gait;Decreased endurance;Decreased mobility;Difficulty walking;Increased muscle spasms;Pain;Increased edema;Decreased range of motion;Decreased activity tolerance;Decreased strength;Increased fascial restricitons;Postural dysfunction;Impaired flexibility   Rehab Potential Good   PT Frequency 2x / week   PT Duration 4 weeks   PT Treatment/Interventions ADLs/Self Care Home Management;Electrical Stimulation;Therapeutic exercise;Patient/family education;Moist Heat;Manual techniques;Neuromuscular re-education;Cryotherapy;Passive range of motion  Home TENS OK after 37 weeks    PT Next Visit Plan review pelvic tilt, Tr A contraction and attempt quadruped rocking. Offer SI belt for support, tape and ice/heat. IFC probablly ok at 37 wks. will F/U   Consulted and Agree with Plan of Care Patient         Problem List Patient Active Problem List    Diagnosis Date Noted  . GERD (gastroesophageal reflux disease) 12/24/2014  . Chest pain 11/29/2014  . DOE (dyspnea on exertion) 11/29/2014  . BMI 37.0-37.9, adult 10/03/2013  . Depression 04/16/2013    PAA,JENNIFER 01/11/2015, 8:13 AM  [facility] [address] [place], [place], [address] Phone: [phone]   Fax:  [phone]

## 2015-01-14 ENCOUNTER — Ambulatory Visit: Payer: Managed Care, Other (non HMO) | Admitting: Physical Therapy

## 2015-01-14 DIAGNOSIS — R262 Difficulty in walking, not elsewhere classified: Secondary | ICD-10-CM

## 2015-01-14 DIAGNOSIS — O26893 Other specified pregnancy related conditions, third trimester: Secondary | ICD-10-CM | POA: Diagnosis not present

## 2015-01-14 DIAGNOSIS — M25559 Pain in unspecified hip: Secondary | ICD-10-CM

## 2015-01-14 NOTE — Therapy (Addendum)
[place], [place], [address] Phone: [phone]   Fax:  [phone]  Physical Therapy Treatment  Patient Details  Name: [name] MRN: [id number] Date of Birth: [birth year] Referring Provider:  Stephens Shire, MD  Encounter Date: 01/14/2015      PT End of Session - 01/14/15 1623    Visit Number 2   Number of Visits 8   Date for PT Re-Evaluation 02/07/15   PT Start Time 1937   PT Stop Time 1623   PT Time Calculation (min) 38 min   Activity Tolerance Patient limited by pain   Behavior During Therapy [facility] for tasks assessed/performed      Past Medical History  Diagnosis Date  . Allergy   . Arthritis   . Anxiety   . Asthma   . Depression   . GERD (gastroesophageal reflux disease)   . IBS (irritable bowel syndrome)   . Diabetes mellitus without complication     Past Surgical History  Procedure Laterality Date  . Cholecystectomy    . Ganglion cyst excision    . Appendectomy      There were no vitals taken for this visit.  Visit Diagnosis:  Pregnancy related hip pain, antepartum, third trimester  Walking difficulty due to pelvic region and thigh      Subjective Assessment - 01/14/15 1547    Symptoms Still ant and post R hip pain; increased difficulty with getting into/out of bed.  Trying to do exercises at home and self massage.  Feels pain is a little worse   Pertinent History history of bursitis, due date 01/29/15, diabetes, hiatal hernia, GERD   Limitations Sitting;Standing;Lifting;Walking;House hold activities;Other (comment)   How long can you sit comfortably? not at all   How long can you stand comfortably? not at all   How long can you walk comfortably? not at all   Diagnostic tests CT scan for chest pain   Patient Stated Goals comfort, be able to have a natural, mobile birth   Currently in Pain? Yes   Pain Score 10-Worst pain ever   Pain Location Hip   Pain Orientation  Right;Anterior;Posterior;Lateral   Pain Descriptors / Indicators Aching;Burning;Heaviness;Spasm   Pain Type Chronic pain   Pain Onset More than a month ago   Pain Frequency Constant   Aggravating Factors  any movement; transitions; walking   Pain Relieving Factors pillows between knees, rest (takes time)                    [facility] Adult PT Treatment/Exercise - 01/14/15 1552    Lumbar Exercises: Stretches   Hip Flexor Stretch 2 reps;30 seconds  Standing   ITB Stretch 2 reps;30 seconds   Lumbar Exercises: Seated   Other Seated Lumbar Exercises pelvic rocking all directions x 20 each on blue physicball   Lumbar Exercises: Supine   Ab Set 10 reps;5 seconds  x 10 pelvic floor   Manual Therapy   Manual Therapy Joint mobilization   Joint Mobilization manual R hip distraction and modified piriformis/addct stretch to tolerance                PT Education - 01/14/15 1623    Education provided Yes   Education Details HEP   Person(s) Educated Patient   Methods Explanation;Demonstration;Handout   Comprehension Verbalized understanding;Returned demonstration             PT Long Term Goals - 01/14/15 1625    PT LONG  TERM GOAL #1   Title Pt will be I with basic HEP for gentle stretching, positioning for comfort   Time 4   Period Weeks   Status On-going   PT LONG TERM GOAL #2   Title Pt will be able to reduce intensity of pain to less than 6/10 with ADLs, household mobility   Time 4   Period Weeks   Status On-going   PT LONG TERM GOAL #3   Title Pt will report less difficulty with finding rest position at night   Time 4   Period Weeks   Status On-going               Plan - 01/14/15 1624    Clinical Impression Statement Continues to have significant discomfort; relieved with R hip distraction and sitting on physioball.  Will continue to benefit from PT to maximize function and comfort.   PT Next Visit Plan SI belt; ice/heat; cont exercise as  tolerated        Problem List Patient Active Problem List   Diagnosis Date Noted  . GERD (gastroesophageal reflux disease) 12/24/2014  . Chest pain 11/29/2014  . DOE (dyspnea on exertion) 11/29/2014  . BMI 37.0-37.9, adult 10/03/2013  . Depression 04/16/2013   Laureen Abrahams, PT, DPT 01/14/2015 4:26 PM  [facility] [address] [place], [place], [address] Phone: [phone]   Fax:  [phone]       PHYSICAL THERAPY DISCHARGE SUMMARY  Visits from Start of Care: 2  Current functional level related to goals / functional outcomes: See above   Remaining deficits: unknown   Education / Equipment: HEP  Plan: Patient agrees to discharge.  Patient goals were not met. Patient is being discharged due to not returning since the last visit.  ?????   Pt went into labor and did not return following birth of child.  Anticipate pain resolved after delivery.  Laureen Abrahams, PT, DPT 10/07/2015 9:55 AM  [facility] [address], [place] [address]  [phone] (office) [phone] (fax)

## 2015-01-14 NOTE — Patient Instructions (Signed)
Unsupported Pelvic Tilt   Gently rock pelvis forward and backward. Do _1__ sets of _15-20__ repetitions.  Copyright  VHI. All rights reserved.  Unsupported Lateral Pelvic Tilt   Gently move hips from side to side. Do _1__ sets of _15-20__ repetitions.  Copyright  VHI. All rights reserved.  Unsupported Pelvic Circle   Gently rotate pelvis clockwise _15-20 times, then counterclockwise _15-20__ times. Do _1__ sets of _15-20__ repetitions.  Copyright  VHI. All rights reserved.   Clarita CraneStephanie F Dannah Ryles, PT, DPT 01/14/2015 4:15 PM  [facility] [address] [place], [place] [address]  [phone] (office) [phone])

## 2015-01-15 ENCOUNTER — Encounter (HOSPITAL_COMMUNITY): Payer: Self-pay

## 2015-01-15 ENCOUNTER — Inpatient Hospital Stay (HOSPITAL_COMMUNITY)
Admission: AD | Admit: 2015-01-15 | Discharge: 2015-01-15 | Disposition: A | Discharge status: Home / Self Care | Payer: Managed Care, Other (non HMO) | Source: Ambulatory Visit | Attending: Obstetrics and Gynecology | Admitting: Obstetrics and Gynecology

## 2015-01-15 DIAGNOSIS — Z3A38 38 weeks gestation of pregnancy: Secondary | ICD-10-CM | POA: Diagnosis not present

## 2015-01-15 DIAGNOSIS — O471 False labor at or after 37 completed weeks of gestation: Secondary | ICD-10-CM | POA: Insufficient documentation

## 2015-01-15 HISTORY — DX: Gestational diabetes mellitus in pregnancy, unspecified control: O24.419

## 2015-01-15 LAB — URINALYSIS, ROUTINE W REFLEX MICROSCOPIC
BILIRUBIN URINE: NEGATIVE
GLUCOSE, UA: NEGATIVE mg/dL
KETONES UR: 15 mg/dL — AB
NITRITE: NEGATIVE
Protein, ur: NEGATIVE mg/dL
Specific Gravity, Urine: 1.02 (ref 1.005–1.030)
Urobilinogen, UA: 0.2 mg/dL (ref 0.0–1.0)
pH: 6 (ref 5.0–8.0)

## 2015-01-15 LAB — URINE MICROSCOPIC-ADD ON

## 2015-01-15 NOTE — Discharge Instructions (Signed)
Braxton Hicks Contractions °Contractions of the uterus can occur throughout pregnancy. Contractions are not always a sign that you are in labor.  °WHAT ARE BRAXTON HICKS CONTRACTIONS?  °Contractions that occur before labor are called Braxton Hicks contractions, or false labor. Toward the end of pregnancy (32-34 weeks), these contractions can develop more often and may become more forceful. This is not true labor because these contractions do not result in opening (dilatation) and thinning of the cervix. They are sometimes difficult to tell apart from true labor because these contractions can be forceful and people have different pain tolerances. You should not feel embarrassed if you go to the hospital with false labor. Sometimes, the only way to tell if you are in true labor is for your health care provider to look for changes in the cervix. °If there are no prenatal problems or other health problems associated with the pregnancy, it is completely safe to be sent home with false labor and await the onset of true labor. °HOW CAN YOU TELL THE DIFFERENCE BETWEEN TRUE AND FALSE LABOR? °False Labor °· The contractions of false labor are usually shorter and not as hard as those of true labor.   °· The contractions are usually irregular.   °· The contractions are often felt in the front of the lower abdomen and in the groin.   °· The contractions may go away when you walk around or change positions while lying down.   °· The contractions get weaker and are shorter lasting as time goes on.   °· The contractions do not usually become progressively stronger, regular, and closer together as with true labor.   °True Labor °1. Contractions in true labor last 30-70 seconds, become very regular, usually become more intense, and increase in frequency.   °2. The contractions do not go away with walking.   °3. The discomfort is usually felt in the top of the uterus and spreads to the lower abdomen and low back.   °4. True labor can  be determined by your health care provider with an exam. This will show that the cervix is dilating and getting thinner.   °WHAT TO REMEMBER °· Keep up with your usual exercises and follow other instructions given by your health care provider.   °· Take medicines as directed by your health care provider.   °· Keep your regular prenatal appointments.   °· Eat and drink lightly if you think you are going into labor.   °· If Braxton Hicks contractions are making you uncomfortable:   °· Change your position from lying down or resting to walking, or from walking to resting.   °· Sit and rest in a tub of warm water.   °· Drink 2-3 glasses of water. Dehydration may cause these contractions.   °· Do slow and deep breathing several times an hour.   °WHEN SHOULD I SEEK IMMEDIATE MEDICAL CARE? °Seek immediate medical care if: °· Your contractions become stronger, more regular, and closer together.   °· You have fluid leaking or gushing from your vagina.   °· You have a fever.   °· You pass blood-tinged mucus.   °· You have vaginal bleeding.   °· You have continuous abdominal pain.   °· You have low back pain that you never had before.   °· You feel your baby's head pushing down and causing pelvic pressure.   °· Your baby is not moving as much as it used to.   °Document Released: 11/09/2005 Document Revised: 11/14/2013 Document Reviewed: 08/21/2013 °[employer]® Patient Information ©2015 [employer]. This information is not intended to replace advice given to you by your health care   provider. Make sure you discuss any questions you have with your health care provider. ° °Fetal Movement Counts °Patient Name: __________________________________________________ Patient Due Date: ____________________ °Performing a fetal movement count is highly recommended in high-risk pregnancies, but it is good for every pregnant woman to do. Your health care provider may ask you to start counting fetal movements at 28 weeks of the pregnancy. Fetal  movements often increase: °· After eating a full meal. °· After physical activity. °· After eating or drinking something sweet or cold. °· At rest. °Pay attention to when you feel the baby is most active. This will help you notice a pattern of your baby's sleep and wake cycles and what factors contribute to an increase in fetal movement. It is important to perform a fetal movement count at the same time each day when your baby is normally most active.  °HOW TO COUNT FETAL MOVEMENTS °5. Find a quiet and comfortable area to sit or lie down on your left side. Lying on your left side provides the best blood and oxygen circulation to your baby. °6. Write down the day and time on a sheet of paper or in a journal. °7. Start counting kicks, flutters, swishes, rolls, or jabs in a 2-hour period. You should feel at least 10 movements within 2 hours. °8. If you do not feel 10 movements in 2 hours, wait 2-3 hours and count again. Look for a change in the pattern or not enough counts in 2 hours. °SEEK MEDICAL CARE IF: °· You feel less than 10 counts in 2 hours, tried twice. °· There is no movement in over an hour. °· The pattern is changing or taking longer each day to reach 10 counts in 2 hours. °· You feel the baby is not moving as he or she usually does. °Date: ____________ Movements: ____________ Start time: ____________ Finish time: ____________  °Date: ____________ Movements: ____________ Start time: ____________ Finish time: ____________ °Date: ____________ Movements: ____________ Start time: ____________ Finish time: ____________ °Date: ____________ Movements: ____________ Start time: ____________ Finish time: ____________ °Date: ____________ Movements: ____________ Start time: ____________ Finish time: ____________ °Date: ____________ Movements: ____________ Start time: ____________ Finish time: ____________ °Date: ____________ Movements: ____________ Start time: ____________ Finish time: ____________ °Date: ____________  Movements: ____________ Start time: ____________ Finish time: ____________  °Date: ____________ Movements: ____________ Start time: ____________ Finish time: ____________ °Date: ____________ Movements: ____________ Start time: ____________ Finish time: ____________ °Date: ____________ Movements: ____________ Start time: ____________ Finish time: ____________ °Date: ____________ Movements: ____________ Start time: ____________ Finish time: ____________ °Date: ____________ Movements: ____________ Start time: ____________ Finish time: ____________ °Date: ____________ Movements: ____________ Start time: ____________ Finish time: ____________ °Date: ____________ Movements: ____________ Start time: ____________ Finish time: ____________  °Date: ____________ Movements: ____________ Start time: ____________ Finish time: ____________ °Date: ____________ Movements: ____________ Start time: ____________ Finish time: ____________ °Date: ____________ Movements: ____________ Start time: ____________ Finish time: ____________ °Date: ____________ Movements: ____________ Start time: ____________ Finish time: ____________ °Date: ____________ Movements: ____________ Start time: ____________ Finish time: ____________ °Date: ____________ Movements: ____________ Start time: ____________ Finish time: ____________ °Date: ____________ Movements: ____________ Start time: ____________ Finish time: ____________  °Date: ____________ Movements: ____________ Start time: ____________ Finish time: ____________ °Date: ____________ Movements: ____________ Start time: ____________ Finish time: ____________ °Date: ____________ Movements: ____________ Start time: ____________ Finish time: ____________ °Date: ____________ Movements: ____________ Start time: ____________ Finish time: ____________ °Date: ____________ Movements: ____________ Start time: ____________ Finish time: ____________ °Date: ____________ Movements: ____________ Start time:  ____________ Finish time: ____________ °Date: ____________ Movements:   ____________ Start time: ____________ Finish time: ____________  °Date: ____________ Movements: ____________ Start time: ____________ Finish time: ____________ °Date: ____________ Movements: ____________ Start time: ____________ Finish time: ____________ °Date: ____________ Movements: ____________ Start time: ____________ Finish time: ____________ °Date: ____________ Movements: ____________ Start time: ____________ Finish time: ____________ °Date: ____________ Movements: ____________ Start time: ____________ Finish time: ____________ °Date: ____________ Movements: ____________ Start time: ____________ Finish time: ____________ °Date: ____________ Movements: ____________ Start time: ____________ Finish time: ____________  °Date: ____________ Movements: ____________ Start time: ____________ Finish time: ____________ °Date: ____________ Movements: ____________ Start time: ____________ Finish time: ____________ °Date: ____________ Movements: ____________ Start time: ____________ Finish time: ____________ °Date: ____________ Movements: ____________ Start time: ____________ Finish time: ____________ °Date: ____________ Movements: ____________ Start time: ____________ Finish time: ____________ °Date: ____________ Movements: ____________ Start time: ____________ Finish time: ____________ °Date: ____________ Movements: ____________ Start time: ____________ Finish time: ____________  °Date: ____________ Movements: ____________ Start time: ____________ Finish time: ____________ °Date: ____________ Movements: ____________ Start time: ____________ Finish time: ____________ °Date: ____________ Movements: ____________ Start time: ____________ Finish time: ____________ °Date: ____________ Movements: ____________ Start time: ____________ Finish time: ____________ °Date: ____________ Movements: ____________ Start time: ____________ Finish time: ____________ °Date:  ____________ Movements: ____________ Start time: ____________ Finish time: ____________ °Date: ____________ Movements: ____________ Start time: ____________ Finish time: ____________  °Date: ____________ Movements: ____________ Start time: ____________ Finish time: ____________ °Date: ____________ Movements: ____________ Start time: ____________ Finish time: ____________ °Date: ____________ Movements: ____________ Start time: ____________ Finish time: ____________ °Date: ____________ Movements: ____________ Start time: ____________ Finish time: ____________ °Date: ____________ Movements: ____________ Start time: ____________ Finish time: ____________ °Date: ____________ Movements: ____________ Start time: ____________ Finish time: ____________ °Document Released: 12/09/2006 Document Revised: 03/26/2014 Document Reviewed: 09/05/2012 °[employer]® Patient Information ©2015 [employer]. This information is not intended to replace advice given to you by your health care provider. Make sure you discuss any questions you have with your health care provider. ° °

## 2015-01-15 NOTE — MAU Note (Signed)
Pt reports contractions, seen at  3:30 in office today and was 1/100. Denies problems with pregnancy. Reports good fetal movement.

## 2015-01-16 ENCOUNTER — Telehealth: Payer: Self-pay | Admitting: *Deleted

## 2015-01-16 ENCOUNTER — Inpatient Hospital Stay (HOSPITAL_COMMUNITY): Payer: Managed Care, Other (non HMO) | Admitting: Anesthesiology

## 2015-01-16 ENCOUNTER — Inpatient Hospital Stay (HOSPITAL_COMMUNITY)
Admission: AD | Admit: 2015-01-16 | Discharge: 2015-01-19 | DRG: 765 | Disposition: A | Discharge status: Home / Self Care | Payer: Managed Care, Other (non HMO) | Source: Ambulatory Visit | Attending: Obstetrics | Admitting: Obstetrics

## 2015-01-16 ENCOUNTER — Encounter (HOSPITAL_COMMUNITY): Payer: Self-pay | Admitting: *Deleted

## 2015-01-16 DIAGNOSIS — O99214 Obesity complicating childbirth: Secondary | ICD-10-CM | POA: Diagnosis present

## 2015-01-16 DIAGNOSIS — O24420 Gestational diabetes mellitus in childbirth, diet controlled: Secondary | ICD-10-CM | POA: Diagnosis present

## 2015-01-16 DIAGNOSIS — Z6841 Body Mass Index (BMI) 40.0 and over, adult: Secondary | ICD-10-CM

## 2015-01-16 DIAGNOSIS — O4292 Full-term premature rupture of membranes, unspecified as to length of time between rupture and onset of labor: Secondary | ICD-10-CM | POA: Diagnosis not present

## 2015-01-16 DIAGNOSIS — Z3A38 38 weeks gestation of pregnancy: Secondary | ICD-10-CM | POA: Diagnosis present

## 2015-01-16 DIAGNOSIS — K589 Irritable bowel syndrome without diarrhea: Secondary | ICD-10-CM | POA: Diagnosis present

## 2015-01-16 DIAGNOSIS — O471 False labor at or after 37 completed weeks of gestation: Secondary | ICD-10-CM | POA: Diagnosis present

## 2015-01-16 DIAGNOSIS — O09513 Supervision of elderly primigravida, third trimester: Secondary | ICD-10-CM

## 2015-01-16 DIAGNOSIS — K219 Gastro-esophageal reflux disease without esophagitis: Secondary | ICD-10-CM | POA: Diagnosis present

## 2015-01-16 DIAGNOSIS — F418 Other specified anxiety disorders: Secondary | ICD-10-CM | POA: Diagnosis present

## 2015-01-16 DIAGNOSIS — O9962 Diseases of the digestive system complicating childbirth: Secondary | ICD-10-CM | POA: Diagnosis present

## 2015-01-16 DIAGNOSIS — O139 Gestational [pregnancy-induced] hypertension without significant proteinuria, unspecified trimester: Secondary | ICD-10-CM | POA: Diagnosis present

## 2015-01-16 DIAGNOSIS — O99343 Other mental disorders complicating pregnancy, third trimester: Secondary | ICD-10-CM | POA: Diagnosis present

## 2015-01-16 LAB — COMPREHENSIVE METABOLIC PANEL
ALT: 16 U/L (ref 0–35)
AST: 25 U/L (ref 0–37)
Albumin: 2.7 g/dL — ABNORMAL LOW (ref 3.5–5.2)
Alkaline Phosphatase: 164 U/L — ABNORMAL HIGH (ref 39–117)
Anion gap: 8 (ref 5–15)
BUN: 6 mg/dL (ref 6–23)
CALCIUM: 9.4 mg/dL (ref 8.4–10.5)
CO2: 18 mmol/L — AB (ref 19–32)
CREATININE: 0.57 mg/dL (ref 0.50–1.10)
Chloride: 109 mmol/L (ref 96–112)
GFR calc Af Amer: >90 mL/min (ref >90)
Glucose, Bld: 139 mg/dL — ABNORMAL HIGH (ref 70–99)
Potassium: 3.6 mmol/L (ref 3.5–5.1)
SODIUM: 135 mmol/L (ref 135–145)
Total Bilirubin: 0.6 mg/dL (ref 0.3–1.2)
Total Protein: 6 g/dL (ref 6.0–8.3)

## 2015-01-16 LAB — URINALYSIS, ROUTINE W REFLEX MICROSCOPIC
Glucose, UA: NEGATIVE mg/dL
Hgb urine dipstick: NEGATIVE
LEUKOCYTES UA: NEGATIVE
Nitrite: NEGATIVE
PROTEIN: 30 mg/dL — AB
Specific Gravity, Urine: >1.03 — ABNORMAL HIGH (ref 1.005–1.030)
Urobilinogen, UA: 0.2 mg/dL (ref 0.0–1.0)
pH: 6 (ref 5.0–8.0)

## 2015-01-16 LAB — URINE MICROSCOPIC-ADD ON

## 2015-01-16 LAB — CBC
HEMATOCRIT: 35.8 % — AB (ref 36.0–46.0)
Hemoglobin: 11.3 g/dL — ABNORMAL LOW (ref 12.0–15.0)
MCH: 27.9 pg (ref 26.0–34.0)
MCHC: 31.6 g/dL (ref 30.0–36.0)
MCV: 88.4 fL (ref 78.0–100.0)
Platelets: 229 10*3/uL (ref 150–400)
RBC: 4.05 MIL/uL (ref 3.87–5.11)
RDW: 18.3 % — ABNORMAL HIGH (ref 11.5–15.5)
WBC: 18.7 10*3/uL — ABNORMAL HIGH (ref 4.0–10.5)

## 2015-01-16 LAB — GLUCOSE, CAPILLARY: Glucose-Capillary: 116 mg/dL — ABNORMAL HIGH (ref 70–99)

## 2015-01-16 LAB — TYPE AND SCREEN
ABO/RH(D): O POS
Antibody Screen: NEGATIVE

## 2015-01-16 LAB — PROTEIN / CREATININE RATIO, URINE
Creatinine, Urine: 225 mg/dL
PROTEIN CREATININE RATIO: 0.24 — AB (ref 0.00–0.15)
Total Protein, Urine: 53 mg/dL

## 2015-01-16 LAB — URIC ACID: URIC ACID, SERUM: 5.9 mg/dL (ref 2.4–7.0)

## 2015-01-16 LAB — LACTATE DEHYDROGENASE: LDH: 179 U/L (ref 94–250)

## 2015-01-16 LAB — ABO/RH: ABO/RH(D): O POS

## 2015-01-16 MED ORDER — EPHEDRINE 5 MG/ML INJ: 10.0000 mg | INTRAVENOUS | Status: DC | PRN

## 2015-01-16 MED ORDER — OXYTOCIN 40 UNITS IN LACTATED RINGERS INFUSION - SIMPLE MED
1.0000 m[IU]/min | INTRAVENOUS | Status: DC
  Administered 2015-01-16: 1 m[IU]/min via INTRAVENOUS

## 2015-01-16 MED ORDER — PHENYLEPHRINE 40 MCG/ML (10ML) SYRINGE FOR IV PUSH (FOR BLOOD PRESSURE SUPPORT): 80.0000 ug | PREFILLED_SYRINGE | INTRAVENOUS | Status: DC | PRN

## 2015-01-16 MED ORDER — LACTATED RINGERS IV SOLN: 500.0000 mL | INTRAVENOUS | Status: DC | PRN

## 2015-01-16 MED ORDER — BUTORPHANOL TARTRATE 1 MG/ML IJ SOLN: 1.0000 mg | INTRAMUSCULAR | Status: DC | PRN

## 2015-01-16 MED ORDER — OXYTOCIN 40 UNITS IN LACTATED RINGERS INFUSION - SIMPLE MED
1.0000 m[IU]/min | INTRAVENOUS | Status: DC
  Administered 2015-01-16: 6 m[IU]/min via INTRAVENOUS
  Administered 2015-01-16: 10 m[IU]/min via INTRAVENOUS
  Administered 2015-01-17: 14 m[IU]/min via INTRAVENOUS
  Administered 2015-01-17: 12 m[IU]/min via INTRAVENOUS

## 2015-01-16 MED ORDER — LACTATED RINGERS IV SOLN: 500.0000 mL | Freq: Once | INTRAVENOUS | Status: DC

## 2015-01-16 MED ORDER — LIDOCAINE HCL (PF) 1 % IJ SOLN: 30.0000 mL | INTRAMUSCULAR | Status: DC | PRN

## 2015-01-16 MED ORDER — OXYTOCIN BOLUS FROM INFUSION: 500.0000 mL | INTRAVENOUS | Status: DC

## 2015-01-16 MED ORDER — ACETAMINOPHEN 325 MG PO TABS
650.0000 mg | ORAL_TABLET | ORAL | Status: DC | PRN
  Administered 2015-01-16: 650 mg via ORAL
  Filled 2015-01-16: qty 2

## 2015-01-16 MED ORDER — FENTANYL 2.5 MCG/ML BUPIVACAINE 1/10 % EPIDURAL INFUSION (WH - ANES)
INTRAMUSCULAR | Status: DC | PRN
  Administered 2015-01-16: 14 mL/h via EPIDURAL

## 2015-01-16 MED ORDER — ONDANSETRON HCL 4 MG/2ML IJ SOLN
4.0000 mg | Freq: Four times a day (QID) | INTRAMUSCULAR | Status: DC | PRN
Start: 2015-01-16 — End: 2015-01-17

## 2015-01-16 MED ORDER — FAMOTIDINE 20 MG PO TABS: 20.0000 mg | ORAL_TABLET | Freq: Every day | ORAL | Status: DC

## 2015-01-16 MED ORDER — TERBUTALINE SULFATE 1 MG/ML IJ SOLN: 0.2500 mg | Freq: Once | INTRAMUSCULAR | Status: AC | PRN

## 2015-01-16 MED ORDER — FENTANYL 2.5 MCG/ML BUPIVACAINE 1/10 % EPIDURAL INFUSION (WH - ANES)
14.0000 mL/h | INTRAMUSCULAR | Status: DC | PRN
  Administered 2015-01-16 – 2015-01-17 (×3): 14 mL/h via EPIDURAL
  Filled 2015-01-16 (×3): qty 125

## 2015-01-16 MED ORDER — DIPHENHYDRAMINE HCL 50 MG/ML IJ SOLN: 12.5000 mg | INTRAMUSCULAR | Status: DC | PRN

## 2015-01-16 MED ORDER — PANTOPRAZOLE SODIUM 40 MG PO TBEC
80.0000 mg | DELAYED_RELEASE_TABLET | Freq: Every day | ORAL | Status: DC
  Administered 2015-01-17 – 2015-01-19 (×3): 80 mg via ORAL
  Filled 2015-01-16 (×4): qty 2

## 2015-01-16 MED ORDER — LACTATED RINGERS IV SOLN
INTRAVENOUS | Status: DC
  Administered 2015-01-16 – 2015-01-17 (×5): via INTRAVENOUS

## 2015-01-16 MED ORDER — PHENYLEPHRINE 40 MCG/ML (10ML) SYRINGE FOR IV PUSH (FOR BLOOD PRESSURE SUPPORT)
80.0000 ug | PREFILLED_SYRINGE | INTRAVENOUS | Status: DC | PRN
  Filled 2015-01-16: qty 20

## 2015-01-16 MED ORDER — SERTRALINE HCL 50 MG PO TABS
50.0000 mg | ORAL_TABLET | Freq: Every day | ORAL | Status: DC
  Administered 2015-01-16 – 2015-01-19 (×4): 50 mg via ORAL
  Filled 2015-01-16 (×5): qty 1

## 2015-01-16 MED ORDER — CITRIC ACID-SODIUM CITRATE 334-500 MG/5ML PO SOLN
30.0000 mL | ORAL | Status: DC | PRN
  Administered 2015-01-16 – 2015-01-17 (×2): 30 mL via ORAL
  Filled 2015-01-16 (×2): qty 15

## 2015-01-16 MED ORDER — FAMOTIDINE IN NACL 20-0.9 MG/50ML-% IV SOLN
20.0000 mg | Freq: Once | INTRAVENOUS | Status: AC
  Administered 2015-01-16: 20 mg via INTRAVENOUS
  Filled 2015-01-16: qty 50

## 2015-01-16 MED ORDER — OXYTOCIN 40 UNITS IN LACTATED RINGERS INFUSION - SIMPLE MED
62.5000 mL/h | INTRAVENOUS | Status: DC
  Filled 2015-01-16: qty 1000

## 2015-01-16 MED ORDER — LIDOCAINE HCL (PF) 1 % IJ SOLN
INTRAMUSCULAR | Status: DC | PRN
  Administered 2015-01-16 (×2): 4 mL

## 2015-01-16 MED ORDER — RANITIDINE HCL 150 MG/10ML PO SYRP
150.0000 mg | ORAL_SOLUTION | Freq: Every day | ORAL | Status: DC
  Filled 2015-01-16: qty 10

## 2015-01-16 NOTE — MAU Note (Signed)
Pt reports UC's q 2-3 min and gush of clear fluid around 0600.

## 2015-01-16 NOTE — Anesthesia Preprocedure Evaluation (Signed)
Anesthesia Evaluation  Patient identified by MRN, date of birth, ID band Patient awake    Reviewed: Allergy & Precautions, Patient's Chart, lab work & pertinent test results  Airway Mallampati: III  TM Distance: >3 FB Neck ROM: Full    Dental no notable dental hx. (+) Teeth Intact   Pulmonary shortness of breath and with exertion, asthma ,  breath sounds clear to auscultation  Pulmonary exam normal       Cardiovascular hypertension, + DOE Rhythm:Regular Rate:Normal  Gestational HTN   Neuro/Psych PSYCHIATRIC DISORDERS Anxiety Depression negative neurological ROS     GI/Hepatic Neg liver ROS, GERD-  Medicated and Controlled,IBS   Endo/Other  diabetes, Well Controlled, Gestational, Oral Hypoglycemic AgentsMorbid obesity  Renal/GU negative Renal ROS     Musculoskeletal  (+) Arthritis -, Osteoarthritis,    Abdominal (+) + obese,   Peds  Hematology  (+) anemia ,   Anesthesia Other Findings   Reproductive/Obstetrics (+) Pregnancy                             Anesthesia Physical Anesthesia Plan  ASA: III  Anesthesia Plan: Epidural   Post-op Pain Management:    Induction:   Airway Management Planned: Natural Airway  Additional Equipment:   Intra-op Plan:   Post-operative Plan:   Informed Consent: I have reviewed the patients History and Physical, chart, labs and discussed the procedure including the risks, benefits and alternatives for the proposed anesthesia with the patient or authorized representative who has indicated his/her understanding and acceptance.     Plan Discussed with: Anesthesiologist  Anesthesia Plan Comments:         Anesthesia Quick Evaluation

## 2015-01-16 NOTE — Anesthesia Procedure Notes (Signed)
Epidural Patient location during procedure: OB Start time: 01/16/2015 11:07 AM  Staffing Anesthesiologist: Donya Hitch A.  Preanesthetic Checklist Completed: patient identified, site marked, surgical consent, pre-op evaluation, timeout performed, IV checked, risks and benefits discussed and monitors and equipment checked  Epidural Patient position: sitting Prep: site prepped and draped and DuraPrep Patient monitoring: continuous pulse ox and blood pressure Approach: midline Location: L4-L5 Injection technique: LOR air  Needle:  Needle type: Tuohy  Needle gauge: 17 G Needle length: 9 cm and 9 Needle insertion depth: 7 cm Catheter type: closed end flexible Catheter size: 19 Gauge Catheter at skin depth: 12 cm Test dose: negative and Other  Assessment Events: blood not aspirated, injection not painful, no injection resistance, negative IV test and no paresthesia  Additional Notes Patient identified. Risks and benefits discussed including failed block, incomplete  Pain control, post dural puncture headache, nerve damage, paralysis, blood pressure Changes, nausea, vomiting, reactions to medications-both toxic and allergic and post Partum back pain. All questions were answered. Patient expressed understanding and wished to proceed. Sterile technique was used throughout procedure. Epidural site was Dressed with sterile barrier dressing. No paresthesias, signs of intravascular injection Or signs of intrathecal spread were encountered.  Patient was more comfortable after the epidural was dosed. Please see RN's note for documentation of vital signs and FHR which are stable.

## 2015-01-16 NOTE — Progress Notes (Signed)
Pt seen and examined.  C/o pelvic pressure  EFM: 140s, mod var, Cat 1 Toco: q2-3 min:  MVUs not adequate at 115-135  SVE: 5/80/-2/posterior  On pitocin x 12 hr, MVUs not adequate.  Will rest pitocin x 30 min then restart 2x2. FSR.

## 2015-01-16 NOTE — MAU Provider Note (Signed)
Subjective:  Ms. [name] is a 34 y.o. female G1P0 at 1038w1d who presents with ?ROM. She started leaking fluid this morning around 0600; the fluid is clear.  I was asked by the RN to perform a speculum exam to look for ROM.   + fetal movement Denies vaginal bleeding.    Objective:  GENERAL: Well-developed, well-nourished female in no acute distress.  HEENT: Normocephalic, atraumatic.   LUNGS: Effort normal SKIN: Warm, dry and without erythema PSYCH: Normal mood and affect  Filed Vitals:   01/16/15 0853  BP: 146/76  Pulse: 104  Resp: 20   MDM  Speculum exam: Vagina - Small amount of pink fluid pooling in the vaginal canal.  No odor  Cervix - No contact bleeding, no active bleeding  Bimanual exam: Done by RN  Dilation: 1 Effacement (%): 70 Cervical Position: Middle Station: -2 Presentation: Vertex Exam by:: L Lamon RN Chaperone present for exam.  On exam, membranes appear ruptured, + pooling of fluid. Negative fern. RN to notify Dr. Chestine Sporelark.    Jessica HansenJennifer Irene Brigitte Soderberg, NP 01/16/2015 9:02 AM

## 2015-01-16 NOTE — Telephone Encounter (Signed)
[name] from [facility] called due to husband called to inform us that the pt is having her baby today....thanks

## 2015-01-16 NOTE — H&P (Signed)
34 y.o. G1P0 @ 404w1d presents to MAU with painful contractions and gush of fluid at 0600.  ROM was equivocal (?pooling but neg fern).  Painful w irregular contractions, SVE 1/70/-2, unchanged from last night.  BPs were elevated 140-15/80s.  Was seen for r/o labor last night with initial BPs 150-150/80-90s.  She is otherwise asymptomatic.  Otherwise has good fetal movement and no bleeding.   Pregnancy c/b:  1. Depression/anxiety on zoloft 2. GDMA2: on glyburide 2.5mg  bid  Last fetal growth 2/4 was 5lb 6oz (44%).    Past Medical History  Diagnosis Date  . Allergy   . Arthritis   . Anxiety   . Asthma   . Depression   . GERD (gastroesophageal reflux disease)   . IBS (irritable bowel syndrome)   . Diabetes mellitus without complication   . Gestational diabetes 2016    glyburide    Past Surgical History  Procedure Laterality Date  . Cholecystectomy    . Ganglion cyst excision    . Appendectomy      OB History  Gravida Para Term Preterm AB SAB TAB Ectopic Multiple Living  1             # Outcome Date GA Lbr Len/2nd Weight Sex Delivery Anes PTL Lv  1 Current               History   Social History  . Marital Status: Married    Spouse Name: N/A  . Number of Children: N/A  . Years of Education: N/A   Occupational History  . Not on file.   Social History Main Topics  . Smoking status: Never Smoker   . Smokeless tobacco: Not on file  . Alcohol Use: No  . Drug Use: No  . Sexual Activity: Yes   Other Topics Concern  . Not on file   Social History Narrative   Codeine; Levaquin; Seroquel; Vicodin; and Wellbutrin    Prenatal Transfer Tool  Maternal Diabetes: Yes:  Diabetes Type:  Insulin/Medication controlled Genetic Screening: Normal Maternal Ultrasounds/Referrals: Normal Fetal Ultrasounds or other Referrals:  None Maternal Substance Abuse:  No Significant Maternal Medications:  Meds include: Other: zoloft, glyburide, prilosec Significant Maternal Lab Results: Lab  values include: Group B Strep negative  ABO, Rh:  O+ Antibody:  Neg Rubella:  Immune RPR:   Neg HBsAg:   Neg HIV:   Neg GBS: Negative (02/08 0000)    Other PNC: uncomplicated.    Filed Vitals:   01/16/15 1000  BP: 153/78  Pulse: 112  Temp:   Resp:      General:  NAD Lungs: CTAB Cardiac: RRR Abdomen:  soft, gravid, EFW 8# SVE:  3-4/70/-2, grossly ruptured FHTs:  140s, mod var, + accels, Cat 1 Toco:  Not tracing well, IUPC placed.    A/P   34 y.o. 144w1d  G1P0 presents with ROM, elevated BPs Elevated BP at term: Admit for IOL.  Will check urine p:c, preE labs.  BPs mild range at this time.  If persistently >160/110 with severe features, will start magnesium GDMA2: on glyburide.  FSBS q4h.  Insulin gtt if persistently elevated.  Pt did not take glyburide last night or this AM.   FSR/ vtx/ GBS 8  [place]uns[place], [facility] Hospit[facility] IncDYANNA

## 2015-01-17 ENCOUNTER — Encounter (HOSPITAL_COMMUNITY)
Admission: AD | Disposition: A | Discharge status: Home / Self Care | Payer: Self-pay | Source: Ambulatory Visit | Attending: Obstetrics

## 2015-01-17 ENCOUNTER — Encounter (HOSPITAL_COMMUNITY): Payer: Self-pay | Admitting: *Deleted

## 2015-01-17 LAB — CBC WITH DIFFERENTIAL/PLATELET
BASOS ABS: 0 10*3/uL (ref 0.0–0.1)
Basophils Relative: 0 % (ref 0–1)
EOS ABS: 0 10*3/uL (ref 0.0–0.7)
EOS PCT: 0 % (ref 0–5)
HCT: 29.8 % — ABNORMAL LOW (ref 36.0–46.0)
Hemoglobin: 9.4 g/dL — ABNORMAL LOW (ref 12.0–15.0)
LYMPHS PCT: 6 % — AB (ref 12–46)
Lymphs Abs: 1 10*3/uL (ref 0.7–4.0)
MCH: 28 pg (ref 26.0–34.0)
MCHC: 31.5 g/dL (ref 30.0–36.0)
MCV: 88.7 fL (ref 78.0–100.0)
Monocytes Absolute: 1 10*3/uL (ref 0.1–1.0)
Monocytes Relative: 6 % (ref 3–12)
Neutro Abs: 13.1 10*3/uL — ABNORMAL HIGH (ref 1.7–7.7)
Neutrophils Relative %: 87 % — ABNORMAL HIGH (ref 43–77)
Platelets: 192 10*3/uL (ref 150–400)
RBC: 3.36 MIL/uL — ABNORMAL LOW (ref 3.87–5.11)
RDW: 18.5 % — AB (ref 11.5–15.5)
WBC: 15.1 10*3/uL — ABNORMAL HIGH (ref 4.0–10.5)

## 2015-01-17 LAB — GLUCOSE, CAPILLARY
Glucose-Capillary: 105 mg/dL — ABNORMAL HIGH (ref 70–99)
Glucose-Capillary: 115 mg/dL — ABNORMAL HIGH (ref 70–99)
Glucose-Capillary: 126 mg/dL — ABNORMAL HIGH (ref 70–99)

## 2015-01-17 LAB — RPR: RPR Ser Ql: NONREACTIVE

## 2015-01-17 SURGERY — Surgical Case
Anesthesia: Epidural | Site: Abdomen

## 2015-01-17 MED ORDER — KETOROLAC TROMETHAMINE 30 MG/ML IJ SOLN
INTRAMUSCULAR | Status: AC
  Administered 2015-01-17: 30 mg via INTRAMUSCULAR
  Filled 2015-01-17: qty 1

## 2015-01-17 MED ORDER — MORPHINE SULFATE (PF) 0.5 MG/ML IJ SOLN
INTRAMUSCULAR | Status: DC | PRN
  Administered 2015-01-17: 3 mg via EPIDURAL

## 2015-01-17 MED ORDER — ONDANSETRON HCL 4 MG PO TABS: 4.0000 mg | ORAL_TABLET | ORAL | Status: DC | PRN

## 2015-01-17 MED ORDER — KETOROLAC TROMETHAMINE 30 MG/ML IJ SOLN: 30.0000 mg | Freq: Four times a day (QID) | INTRAMUSCULAR | Status: DC | PRN

## 2015-01-17 MED ORDER — DIPHENHYDRAMINE HCL 50 MG/ML IJ SOLN: 12.5000 mg | INTRAMUSCULAR | Status: DC | PRN

## 2015-01-17 MED ORDER — SCOPOLAMINE 1 MG/3DAYS TD PT72
1.0000 | MEDICATED_PATCH | Freq: Once | TRANSDERMAL | Status: DC
  Administered 2015-01-17: 1.5 mg via TRANSDERMAL

## 2015-01-17 MED ORDER — MEPERIDINE HCL 25 MG/ML IJ SOLN
INTRAMUSCULAR | Status: AC
  Filled 2015-01-17: qty 1

## 2015-01-17 MED ORDER — SIMETHICONE 80 MG PO CHEW
80.0000 mg | CHEWABLE_TABLET | Freq: Three times a day (TID) | ORAL | Status: DC
  Administered 2015-01-17 – 2015-01-19 (×7): 80 mg via ORAL
  Filled 2015-01-17 (×7): qty 1

## 2015-01-17 MED ORDER — KETOROLAC TROMETHAMINE 30 MG/ML IJ SOLN
30.0000 mg | Freq: Four times a day (QID) | INTRAMUSCULAR | Status: DC | PRN
  Administered 2015-01-17: 30 mg via INTRAMUSCULAR

## 2015-01-17 MED ORDER — DIPHENHYDRAMINE HCL 25 MG PO CAPS: 25.0000 mg | ORAL_CAPSULE | Freq: Four times a day (QID) | ORAL | Status: DC | PRN

## 2015-01-17 MED ORDER — SCOPOLAMINE 1 MG/3DAYS TD PT72
MEDICATED_PATCH | TRANSDERMAL | Status: AC
  Filled 2015-01-17: qty 1

## 2015-01-17 MED ORDER — KETOROLAC TROMETHAMINE 30 MG/ML IJ SOLN: 30.0000 mg | Freq: Four times a day (QID) | INTRAMUSCULAR | Status: AC | PRN

## 2015-01-17 MED ORDER — LACTATED RINGERS IV SOLN
INTRAVENOUS | Status: DC
  Administered 2015-01-17 – 2015-01-18 (×2): via INTRAVENOUS

## 2015-01-17 MED ORDER — NALOXONE HCL 1 MG/ML IJ SOLN
1.0000 ug/kg/h | INTRAVENOUS | Status: DC | PRN
  Filled 2015-01-17: qty 2

## 2015-01-17 MED ORDER — NALBUPHINE HCL 10 MG/ML IJ SOLN: 5.0000 mg | Freq: Once | INTRAMUSCULAR | Status: AC | PRN

## 2015-01-17 MED ORDER — PRENATAL MULTIVITAMIN CH
1.0000 | ORAL_TABLET | Freq: Every day | ORAL | Status: DC
  Administered 2015-01-18 – 2015-01-19 (×2): 1 via ORAL
  Filled 2015-01-17 (×2): qty 1

## 2015-01-17 MED ORDER — LACTATED RINGERS IV SOLN
INTRAVENOUS | Status: DC | PRN
  Administered 2015-01-17 (×2): via INTRAVENOUS

## 2015-01-17 MED ORDER — OXYTOCIN 40 UNITS IN LACTATED RINGERS INFUSION - SIMPLE MED: 62.5000 mL/h | INTRAVENOUS | Status: AC

## 2015-01-17 MED ORDER — LACTATED RINGERS IV SOLN: INTRAVENOUS | Status: DC

## 2015-01-17 MED ORDER — ONDANSETRON HCL 4 MG/2ML IJ SOLN
4.0000 mg | Freq: Three times a day (TID) | INTRAMUSCULAR | Status: DC | PRN
  Filled 2015-01-17: qty 2

## 2015-01-17 MED ORDER — NALBUPHINE HCL 10 MG/ML IJ SOLN: 5.0000 mg | INTRAMUSCULAR | Status: DC | PRN

## 2015-01-17 MED ORDER — DIPHENHYDRAMINE HCL 25 MG PO CAPS
25.0000 mg | ORAL_CAPSULE | ORAL | Status: DC | PRN
  Administered 2015-01-17: 25 mg via ORAL
  Filled 2015-01-17: qty 1

## 2015-01-17 MED ORDER — SENNOSIDES-DOCUSATE SODIUM 8.6-50 MG PO TABS
2.0000 | ORAL_TABLET | ORAL | Status: DC
  Administered 2015-01-17 – 2015-01-18 (×2): 2 via ORAL
  Filled 2015-01-17 (×2): qty 2

## 2015-01-17 MED ORDER — SCOPOLAMINE 1 MG/3DAYS TD PT72
1.0000 | MEDICATED_PATCH | Freq: Once | TRANSDERMAL | Status: DC
  Filled 2015-01-17: qty 1

## 2015-01-17 MED ORDER — WITCH HAZEL-GLYCERIN EX PADS: 1.0000 "application " | MEDICATED_PAD | CUTANEOUS | Status: DC | PRN

## 2015-01-17 MED ORDER — SODIUM CHLORIDE 0.9 % IJ SOLN: 3.0000 mL | INTRAMUSCULAR | Status: DC | PRN

## 2015-01-17 MED ORDER — PHENYLEPHRINE 40 MCG/ML (10ML) SYRINGE FOR IV PUSH (FOR BLOOD PRESSURE SUPPORT)
PREFILLED_SYRINGE | INTRAVENOUS | Status: AC
  Filled 2015-01-17: qty 10

## 2015-01-17 MED ORDER — DIPHENHYDRAMINE HCL 25 MG PO CAPS: 25.0000 mg | ORAL_CAPSULE | ORAL | Status: DC | PRN

## 2015-01-17 MED ORDER — MEPERIDINE HCL 25 MG/ML IJ SOLN
INTRAMUSCULAR | Status: DC | PRN
  Administered 2015-01-17: 12.5 mg via INTRAVENOUS

## 2015-01-17 MED ORDER — MENTHOL 3 MG MT LOZG: 1.0000 | LOZENGE | OROMUCOSAL | Status: DC | PRN

## 2015-01-17 MED ORDER — LIDOCAINE-EPINEPHRINE (PF) 2 %-1:200000 IJ SOLN
INTRAMUSCULAR | Status: AC
  Filled 2015-01-17: qty 20

## 2015-01-17 MED ORDER — TETANUS-DIPHTH-ACELL PERTUSSIS 5-2.5-18.5 LF-MCG/0.5 IM SUSP: 0.5000 mL | Freq: Once | INTRAMUSCULAR | Status: DC

## 2015-01-17 MED ORDER — SIMETHICONE 80 MG PO CHEW
80.0000 mg | CHEWABLE_TABLET | ORAL | Status: DC
  Administered 2015-01-17 – 2015-01-18 (×2): 80 mg via ORAL
  Filled 2015-01-17 (×2): qty 1

## 2015-01-17 MED ORDER — FENTANYL CITRATE 0.05 MG/ML IJ SOLN: 25.0000 ug | INTRAMUSCULAR | Status: DC | PRN

## 2015-01-17 MED ORDER — ONDANSETRON HCL 4 MG/2ML IJ SOLN
INTRAMUSCULAR | Status: AC
  Filled 2015-01-17: qty 2

## 2015-01-17 MED ORDER — MORPHINE SULFATE 0.5 MG/ML IJ SOLN
INTRAMUSCULAR | Status: AC
Start: 2015-01-17 — End: 2015-01-17
  Filled 2015-01-17: qty 10

## 2015-01-17 MED ORDER — SODIUM BICARBONATE 8.4 % IV SOLN
INTRAVENOUS | Status: DC | PRN
  Administered 2015-01-17 (×2): 5 mL via EPIDURAL

## 2015-01-17 MED ORDER — PHENYLEPHRINE HCL 10 MG/ML IJ SOLN
INTRAMUSCULAR | Status: DC | PRN
  Administered 2015-01-17 (×4): 40 ug via INTRAVENOUS

## 2015-01-17 MED ORDER — DEXTROSE 5 % IV SOLN
3.0000 g | INTRAVENOUS | Status: DC | PRN
  Administered 2015-01-17: 3 g via INTRAVENOUS

## 2015-01-17 MED ORDER — LACTATED RINGERS IV BOLUS (SEPSIS): 500.0000 mL | Freq: Once | INTRAVENOUS | Status: DC

## 2015-01-17 MED ORDER — DEXTROSE 5 % IV SOLN
INTRAVENOUS | Status: AC
  Filled 2015-01-17: qty 3000

## 2015-01-17 MED ORDER — MEPERIDINE HCL 25 MG/ML IJ SOLN: 6.2500 mg | INTRAMUSCULAR | Status: DC | PRN

## 2015-01-17 MED ORDER — DIBUCAINE 1 % RE OINT: 1.0000 "application " | TOPICAL_OINTMENT | RECTAL | Status: DC | PRN

## 2015-01-17 MED ORDER — NALOXONE HCL 0.4 MG/ML IJ SOLN: 0.4000 mg | INTRAMUSCULAR | Status: DC | PRN

## 2015-01-17 MED ORDER — ONDANSETRON HCL 4 MG/2ML IJ SOLN
INTRAMUSCULAR | Status: DC | PRN
  Administered 2015-01-17: 4 mg via INTRAVENOUS

## 2015-01-17 MED ORDER — SCOPOLAMINE 1 MG/3DAYS TD PT72
MEDICATED_PATCH | TRANSDERMAL | Status: DC | PRN
  Administered 2015-01-17: 1 via TRANSDERMAL

## 2015-01-17 MED ORDER — OXYTOCIN 10 UNIT/ML IJ SOLN
40.0000 [IU] | INTRAVENOUS | Status: DC | PRN
  Administered 2015-01-17: 40 [IU] via INTRAVENOUS

## 2015-01-17 MED ORDER — SIMETHICONE 80 MG PO CHEW
80.0000 mg | CHEWABLE_TABLET | ORAL | Status: DC | PRN
  Filled 2015-01-17: qty 1

## 2015-01-17 MED ORDER — ONDANSETRON HCL 4 MG/2ML IJ SOLN: 4.0000 mg | Freq: Three times a day (TID) | INTRAMUSCULAR | Status: DC | PRN

## 2015-01-17 MED ORDER — SODIUM BICARBONATE 8.4 % IV SOLN
INTRAVENOUS | Status: AC
Start: 2015-01-17 — End: 2015-01-17
  Filled 2015-01-17: qty 50

## 2015-01-17 MED ORDER — OXYCODONE-ACETAMINOPHEN 5-325 MG PO TABS: 2.0000 | ORAL_TABLET | ORAL | Status: DC | PRN

## 2015-01-17 MED ORDER — SCOPOLAMINE 1 MG/3DAYS TD PT72
MEDICATED_PATCH | TRANSDERMAL | Status: AC
  Administered 2015-01-17: 1.5 mg via TRANSDERMAL
  Filled 2015-01-17: qty 1

## 2015-01-17 MED ORDER — IBUPROFEN 600 MG PO TABS
600.0000 mg | ORAL_TABLET | Freq: Four times a day (QID) | ORAL | Status: DC
  Administered 2015-01-17 – 2015-01-19 (×7): 600 mg via ORAL
  Filled 2015-01-17 (×7): qty 1

## 2015-01-17 MED ORDER — OXYTOCIN 10 UNIT/ML IJ SOLN
INTRAMUSCULAR | Status: AC
  Filled 2015-01-17: qty 4

## 2015-01-17 MED ORDER — LANOLIN HYDROUS EX OINT: 1.0000 "application " | TOPICAL_OINTMENT | CUTANEOUS | Status: DC | PRN

## 2015-01-17 MED ORDER — IBUPROFEN 600 MG PO TABS
600.0000 mg | ORAL_TABLET | Freq: Four times a day (QID) | ORAL | Status: DC | PRN
  Administered 2015-01-17: 600 mg via ORAL
  Filled 2015-01-17 (×2): qty 1

## 2015-01-17 MED ORDER — OXYCODONE-ACETAMINOPHEN 5-325 MG PO TABS: 1.0000 | ORAL_TABLET | ORAL | Status: DC | PRN

## 2015-01-17 MED ORDER — ONDANSETRON HCL 4 MG/2ML IJ SOLN
4.0000 mg | INTRAMUSCULAR | Status: DC | PRN
  Administered 2015-01-17: 4 mg via INTRAVENOUS

## 2015-01-17 MED ORDER — PROMETHAZINE HCL 25 MG/ML IJ SOLN
25.0000 mg | Freq: Four times a day (QID) | INTRAMUSCULAR | Status: DC | PRN
  Administered 2015-01-17: 25 mg via INTRAVENOUS
  Filled 2015-01-17: qty 1

## 2015-01-17 SURGICAL SUPPLY — 34 items
APL SKNCLS STERI-STRIP NONHPOA (GAUZE/BANDAGES/DRESSINGS) ×1
BENZOIN TINCTURE PRP APPL 2/3 (GAUZE/BANDAGES/DRESSINGS) ×2 IMPLANT
CLAMP CORD UMBIL (MISCELLANEOUS) IMPLANT
CLOTH BEACON ORANGE TIMEOUT ST (SAFETY) ×2 IMPLANT
DRAPE SHEET LG 3/4 BI-LAMINATE (DRAPES) IMPLANT
DRSG OPSITE POSTOP 4X10 (GAUZE/BANDAGES/DRESSINGS) ×2 IMPLANT
DURAPREP 26ML APPLICATOR (WOUND CARE) ×2 IMPLANT
ELECT REM PT RETURN 9FT ADLT (ELECTROSURGICAL) ×2
ELECTRODE REM PT RTRN 9FT ADLT (ELECTROSURGICAL) ×1 IMPLANT
EXTRACTOR VACUUM KIWI (MISCELLANEOUS) IMPLANT
GLOVE BIO SURGEON STRL SZ 6 (GLOVE) ×2 IMPLANT
GLOVE INDICATOR 6.5 STRL GRN (GLOVE) ×2 IMPLANT
GOWN STRL REUS W/TWL LRG LVL3 (GOWN DISPOSABLE) ×4 IMPLANT
KIT ABG SYR 3ML LUER SLIP (SYRINGE) IMPLANT
LIQUID BAND (GAUZE/BANDAGES/DRESSINGS) IMPLANT
NDL HYPO 25X5/8 SAFETYGLIDE (NEEDLE) IMPLANT
NEEDLE HYPO 25X5/8 SAFETYGLIDE (NEEDLE) IMPLANT
NS IRRIG 1000ML POUR BTL (IV SOLUTION) ×2 IMPLANT
PACK C SECTION WH (CUSTOM PROCEDURE TRAY) ×2 IMPLANT
PAD OB MATERNITY 4.3X12.25 (PERSONAL CARE ITEMS) ×2 IMPLANT
RTRCTR C-SECT PINK 25CM LRG (MISCELLANEOUS) IMPLANT
STRIP CLOSURE SKIN 1/2X4 (GAUZE/BANDAGES/DRESSINGS) ×2 IMPLANT
SUT MNCRL 0 VIOLET CTX 36 (SUTURE) ×2 IMPLANT
SUT MNCRL AB 3-0 PS2 27 (SUTURE) ×2 IMPLANT
SUT MONOCRYL 0 CTX 36 (SUTURE) ×2
SUT PLAIN 0 NONE (SUTURE) IMPLANT
SUT PLAIN 2 0 (SUTURE) ×2
SUT PLAIN ABS 2-0 CT1 27XMFL (SUTURE) ×1 IMPLANT
SUT VIC AB 0 CTX 36 (SUTURE) ×4
SUT VIC AB 0 CTX36XBRD ANBCTRL (SUTURE) ×2 IMPLANT
SUT VIC AB 2-0 CT1 27 (SUTURE) ×2
SUT VIC AB 2-0 CT1 TAPERPNT 27 (SUTURE) ×1 IMPLANT
TOWEL OR 17X24 6PK STRL BLUE (TOWEL DISPOSABLE) ×2 IMPLANT
TRAY FOLEY CATH 14FR (SET/KITS/TRAYS/PACK) ×1 IMPLANT

## 2015-01-17 NOTE — Addendum Note (Signed)
Addendum  created 01/17/15 1314 by Turner DanielsJennifer L Rahn Lacuesta, CRNA   Modules edited: Notes Section   Notes Section:  File: [id number]

## 2015-01-17 NOTE — Progress Notes (Signed)
Patient continually dropping O2 sats into low 80's when falling asleep. Encouraged patient to take deep breaths when O2 falls. After continually happening, OB called. Ordered oxygen at 100%.

## 2015-01-17 NOTE — Anesthesia Postprocedure Evaluation (Signed)
Anesthesia Post Note  Patient: [name]  Procedure(s) Performed: Procedure(s) (LRB): CESAREAN SECTION (N/A)  Anesthesia type: Epidural  Patient location: PACU  Post pain: Pain level controlled  Post assessment: Post-op Vital signs reviewed  Last Vitals:  Filed Vitals:   01/17/15 0645  BP: 110/66  Pulse: 99  Temp:   Resp: 20    Post vital signs: Reviewed  Level of consciousness: awake  Complications: No apparent anesthesia complications

## 2015-01-17 NOTE — Op Note (Signed)
Cesarean Section Procedure Note  Pre-operative Diagnosis: 1. Intrauterine pregnancy at 5352w1d  2. Failure to progress   Post-operative Diagnosis: same as above  Surgeon: Marlow Baarsyanna Sabena Winner, MD  Procedure: Primary low transverse cesarean section   Anesthesia: Epidural anesthesia  Estimated Blood Loss: 600 mL         Drains: Foley catheter         Specimens: placenta to pathology         Implants: none         Complications:  None; patient tolerated the procedure well.         Disposition: PACU - hemodynamically stable.  Findings:  Nloudy amniotic fluid present upon hysterotomy, normal uterus, tubes and ovaries bilaterally.  Viable female infant, weight pending, Apgars 7, 9.    Procedure Details   After epidural anesthesia was found to adequate , the patient was placed in the dorsal supine position with a leftward tilt, draped and prepped in the usual sterile manner. A Pfannenstiel incision was made and carried down through the subcutaneous tissue to the fascia. The fascia was incised in the midline and the fascial incision was extended laterally with Mayo scissors. The superior aspect of the fascial incision was grasped with two Kocher clamp, tented up and the rectus muscles dissected off sharply. The rectus was then dissected off with blunt dissection and Mayo scissors inferiorly. The rectus muscles were separated in the midline. The abdominal peritoneum was identified, tented up, entered bluntly, and the incision was extended superiorly and inferiorly with good visualization of the bladder. The Alexis retractor was deployed. The vesicouterine peritoneum was identified, tented up, entered sharply, and the bladder flap was created digitally. Scalpel was then used to make a low transverse incision on the uterus which was extended in the cephalad-caudad direction with blunt dissection. The fluid was cloudy. The fetal vertex was identified, elevated out of the pelvis and brought to the hysterotomy.  The head was delivered easily followed by the shoulders and body. The cord was clamped and cut and the infant was passed to the waiting neonatologist. Placenta was then delivered spontaneously and intact.  Membranes were also cloudy and yellow appearing. The uterus was cleared of all clot and debris   The hysterotomy was repaired with #0 Monocryl in running locked fashion. A second imbricating layer was placed w #0 Monocryl. The hysterotomy was reexamined and excellent hemostasis was noted.  The Alexis retractor was removed from the abdomen. The peritoneum was examined and all vessels noted to be hemostatic. The abdominal cavity was cleared of all clot and debris.  The peritoneum was closed with 2-0 vicryl in a running fashion, taking care to avoid the many large peritoneal vessels. The fascia and rectus muscles were inspected and were hemostatic. The fascia was closed with 0 Vicryl in a running fashion. The subcuticular layer was irrigated and all bleeders cauterized.  The subcutaneous layer was re approximated with interrupted 3-0 plain gut.  The skin was closed with 3-0 monocryl in a subcuticular fashion. The incision was dressed with benzoine, steri strips and pressure dressing. All sponge lap and needle counts were correct x3. Patient tolerated the procedure well and recovered in stable condition following the procedure.

## 2015-01-17 NOTE — Progress Notes (Signed)
MOB was referred for history of depression/anxiety.  Referral is screened out by Clinical Social Worker because none of the following criteria appear to apply: -History of anxiety/depression during this pregnancy, or of post-partum depression. - Diagnosis of anxiety and/or depression within last 3 years - History of depression due to pregnancy loss/loss of child or -MOB's symptoms are currently being treated with medication and/or therapy.  CSW completed chart review.  MOB is currently prescribed Zoloft. Prenatal records also document that she is followed by a psychiatrist.  Please contact the Clinical Social Worker if needs arise or upon MOB request.   Loleta BooksSarah Theodosia Bahena, LCSW Clinical Social Worker [phone]

## 2015-01-17 NOTE — Progress Notes (Signed)
Patient seen and examined.  Was rested off of pitocin and restarted.  Still not adequate MVUs 4 hours later.  Pitocin was stopped 45 min ago due to recurrent variable decels x 10 min, now cat 1 w accels.  She had a one time fever at 2000 that responded to tylenol.  Normal pulse, no fetal tachycardia. No signs/symptoms of chorio.    BP 132/81 mmHg  Pulse 91  Temp(Src) 98.9 F (37.2 C) (Oral)  Resp 20  Ht 5\' 2"  (1.575 m)  Wt 117.482 kg (259 lb)  BMI 47.36 kg/m2  SpO2 97%  SVE: 5/80/-2 EFM: 140s, mod var FSBS: 105  I discussed with the patient and her husband that we have been unable to achieve adequate uterine contractions.  She has now been 5 cm for approximately 12 hours and unable to get into active labor. My concern, in the setting of GDMA2, is CPD.  At this time, I recommend proceeding to the operating room for primary cesarean section for failure to progress.  Risks to include infection, bleeding, damage to surrounding structures (bowel, bladder, tubes, ovaries, vessels, nerves, baby).  All questions answered, consent signed.

## 2015-01-17 NOTE — Addendum Note (Signed)
Addendum  created 01/17/15 [id number] by Leilani AbleFranklin Reeder Brisby, MD   Modules edited: Orders, PRL Based Order Sets

## 2015-01-17 NOTE — Lactation Note (Signed)
This note was copied from the chart of Jessica Jearld AdjutantRobyn Meader. Lactation Consultation Note Follow up visit at 12 hours of age [facility], due to moms being medicated and unaware of previous visit.  Mom remains very sleepy.  Baby showing feeding cues.  Left breast tissue tough and edematous.  Hand pump given with instructions to try to soften tissue.  Hand expression attempted from left breast unable to collect for spoon feeding.  Baby latched to right breast after hand expression of several drops.  Baby latched well in football hold with FOB at bedside to assist very sleepy mom.  Mom to call for assist as needed and encouraged to hand express and spoon feed from left breast if unable to latch.   Patient Name: [name] ZOXWR'UToday's Date: 01/17/2015 Reason for consult: Follow-up assessment   Maternal Data Has patient been taught Hand Expression?: Yes (2-3 drops of colostrum noted )  Feeding Feeding Type: Breast Fed Length of feed: 10 min  LATCH Score/Interventions Latch: Grasps breast easily, tongue down, lips flanged, rhythmical sucking. Intervention(s): Adjust position;Assist with latch;Breast massage;Breast compression  Audible Swallowing: A few with stimulation  Type of Nipple: Flat Intervention(s): Hand pump  Comfort (Breast/Nipple): Soft / non-tender     Hold (Positioning): Assistance needed to correctly position infant at breast and maintain latch. Intervention(s): Breastfeeding basics reviewed;Support Pillows;Position options;Skin to skin  LATCH Score: 7  Lactation Tools Discussed/Used Initiated by:: JS Date initiated:: 01/17/15   Consult Status Consult Status: Follow-up Date: 01/18/15 Follow-up type: In-patient    [name], [name] 01/17/2015, 6:19 PM

## 2015-01-17 NOTE — Anesthesia Postprocedure Evaluation (Signed)
  Anesthesia Post-op Note  Anesthesia Post Note  Patient: [name]  Procedure(s) Performed: Procedure(s) (LRB): CESAREAN SECTION (N/A)  Anesthesia type: Epidural  Patient location: Mother/Baby  Post pain: Pain level controlled  Post assessment: Post-op Vital signs reviewed  Last Vitals:  Filed Vitals:   01/17/15 1100  BP: 124/58  Pulse: 80  Temp: 36.6 C  Resp: 18    Post vital signs: Reviewed  Level of consciousness:alert  Complications: No apparent anesthesia complications

## 2015-01-17 NOTE — Lactation Note (Signed)
This note was copied from the chart of [name]. Lactation Consultation Note  Patient Name: [name] HKVQQ'VToday's Date: 01/17/2015 Reason for consult: Initial assessment;Other (Comment) (see LC note )  Baby is 9 hours old and presently mom is resting in bed with O2 mask on and has just received med for  [facility], and mom is groggy from med and intermittently falling asleep. Per [facility] RN , mom concerned baby hasn't eat'en since this am. Baby presently sound asleep in crib and  not showing any signs of hunger. [facility] RN mentioned mom has flat nipples , LC assessed breast tissue with mom being awake  And noted the areola on the right to be compressible and 2-3 drops of colostrum, and the left areola tough semi compressible. LC suggested to [facility] , mom and dad , to allow meds to work and to try latching within the hour. Even spoon feeding if needed. Mother informed of post-discharge support and given phone number to the lactation department, including services for phone call assistance; out-patient  appointments; and breastfeeding support group. List of other breastfeeding resources in the community given in the handout. Encouraged mother to call for  problems or concerns related to breastfeeding.    Maternal Data Has patient been taught Hand Expression?: Yes (2-3 drops of colostrum noted )  Feeding    LATCH Score/Interventions                      Lactation Tools Discussed/Used     Consult Status Consult Status: Follow-up Date: 01/17/15 Follow-up type: In-patient    Kathrin Greathouseorio, [name] 01/17/2015, 3:25 PM

## 2015-01-17 NOTE — Transfer of Care (Signed)
Immediate Anesthesia Transfer of Care Note  Patient: [name]  Procedure(s) Performed: Procedure(s): CESAREAN SECTION (N/A)  Patient Location: PACU  Anesthesia Type:Epidural  Level of Consciousness: awake, alert  and oriented  Airway & Oxygen Therapy: Patient Spontanous Breathing  Post-op Assessment: Report given to RN and Post -op Vital signs reviewed and stable  Post vital signs: Reviewed and stable  Last Vitals:  Filed Vitals:   01/17/15 0431  BP: 129/70  Pulse: 83  Temp:   Resp:     Complications: No apparent anesthesia complications

## 2015-01-17 NOTE — Brief Op Note (Signed)
01/16/2015 - 01/17/2015  6:05 AM  PATIENT:  [name]  34 y.o. female  PRE-OPERATIVE DIAGNOSIS:  Failure to Progress  POST-OPERATIVE DIAGNOSIS:  Failure to Progress  PROCEDURE:  Procedure(s): CESAREAN SECTION (N/A)  SURGEON:  Surgeon(s) and Role:    * Marlow Baarsyanna Yailin Biederman, MD - Primary  ANESTHESIA:   epidural  EBL:  Total I/O In: 1550 [I.V.:1550] Out: 1175 [Urine:575; Blood:600]  BLOOD ADMINISTERED:none  DRAINS: none   LOCAL MEDICATIONS USED:  NONE  SPECIMEN:  Source of Specimen:  placenta  DISPOSITION OF SPECIMEN:  PATHOLOGY  COUNTS:  YES  TOURNIQUET:  * No tourniquets in log *  DICTATION: .Note written in EPIC  PLAN OF CARE: Admit to inpatient   PATIENT DISPOSITION:  PACU - hemodynamically stable.   Delay start of Pharmacological VTE agent (>24hrs) due to surgical blood loss or risk of bleeding: not applicable

## 2015-01-18 ENCOUNTER — Encounter (HOSPITAL_COMMUNITY): Payer: Self-pay | Admitting: Obstetrics

## 2015-01-18 LAB — CBC
HCT: 24.9 % — ABNORMAL LOW (ref 36.0–46.0)
HEMOGLOBIN: 7.9 g/dL — AB (ref 12.0–15.0)
MCH: 28.2 pg (ref 26.0–34.0)
MCHC: 31.7 g/dL (ref 30.0–36.0)
MCV: 88.9 fL (ref 78.0–100.0)
Platelets: 178 10*3/uL (ref 150–400)
RBC: 2.8 MIL/uL — AB (ref 3.87–5.11)
RDW: 18.5 % — ABNORMAL HIGH (ref 11.5–15.5)
WBC: 11.6 10*3/uL — ABNORMAL HIGH (ref 4.0–10.5)

## 2015-01-18 LAB — GLUCOSE, CAPILLARY: Glucose-Capillary: 88 mg/dL (ref 70–99)

## 2015-01-18 MED ORDER — OXYCODONE-ACETAMINOPHEN 5-325 MG PO TABS: 1.0000 | ORAL_TABLET | ORAL | Status: DC | PRN

## 2015-01-18 NOTE — Progress Notes (Signed)
  Patient is eating, ambulating, voiding.  Pain control is good.  Filed Vitals:   01/17/15 1953 01/17/15 2304 01/18/15 0300 01/18/15 0550  BP:  101/57 101/43 112/50  Pulse: 78 70 72 87  Temp: 98.6 F (37 C) 98.5 F (36.9 C) 98.3 F (36.8 C) 98.1 F (36.7 C)  TempSrc: Oral Oral Oral Oral  Resp: 16 16 16 16   Height:      Weight:      SpO2: 95% 95% 95% 96%    lungs:   clear to auscultation cor:    RRR Abdomen:  soft, appropriate tenderness, incisions intact and without erythema or exudate ex:    no cords   Lab Results  Component Value Date   WBC 11.6* 01/18/2015   HGB 7.9* 01/18/2015   HCT 24.9* 01/18/2015   MCV 88.9 01/18/2015   PLT 178 01/18/2015    --/--/O POS, O POS (02/24 0920)/RI  A/P    Post operative day 1.  Routine post op and postpartum care.  Expect d/c tomorrow.  Percocet for pain control. Sats better on O2- if drop while awake, may need CXR.  Pt probably has sleep apnea.

## 2015-01-18 NOTE — Lactation Note (Signed)
This note was copied from the chart of Jessica Jearld AdjutantRobyn Chamblin. Lactation Consultation Note: Mother states she was able to latch infant on the Rt breast with the #20 nipple shield. She states infant fed for 7 mins. After teaching with mother , mother states that infant had a poor latch and she was unable to hear swallows or see colostrum in the nipple shield. Reviewed proper use of the nipple shield. Several attempts to latch infant onto Rt breat with a #20 nipple shield. Latch was very shallow. #24 nipple shield placed on the Rt nipple and infant sustained deeper latch nut still not a good wide gape. FOB in room at bedside for teaching. I used a tea cup hold to the side of the areola and infant latched well with good depth without the nipple shield. Observed good pattern of suckling and swallows. When infant released the breast the nipple was round. Mother declines feeling any pain. She does have a blood blister on the tip of the RT nipple. She was given comfort gels. Mother also has inverted nipple shells to put on with a bra. Mother was advised to put on her bra due to dependent edema of her breast. Mother s left nipple is flat. Mother taught to firm nipple. She has her own PIS. Pump was sat up with instructions to post pump after feedings. Advised mother to breastfeed 8-12 times in 24 hours . Discussed cluster feeding. Parents receptive to all teaching. Mother to page for assistance as needed.  Patient Name: Jessica Jearld AdjutantRobyn Holzmann [id number]'BToday's Date: 01/18/2015 Reason for consult: Follow-up assessment   Maternal Data    Feeding Feeding Type: Breast Fed Length of feed: 15 min  LATCH Score/Interventions Latch: Grasps breast easily, tongue down, lips flanged, rhythmical sucking.  Audible Swallowing: A few with stimulation  Type of Nipple: Everted at rest and after stimulation (Rt nipple firms well, Left nipple flat with areola edema)  Comfort (Breast/Nipple): Soft / non-tender (tiny blood blister on the tip, mom denies  pain)     Hold (Positioning): Assistance needed to correctly position infant at breast and maintain latch. Intervention(s): Support Pillows;Position options  LATCH Score: 8  Lactation Tools Discussed/Used Tools: Nipple Shields Nipple shield size: 24   Consult Status Consult Status: Follow-up Date: 01/18/15 Follow-up type: In-patient    Stevan BornKendrick, Ola Raap [facility] 01/18/2015, 2:41 PM

## 2015-01-18 NOTE — Lactation Note (Signed)
This note was copied from the chart of Jessica Jearld AdjutantRobyn Catano. Lactation Consultation Note RN called to say she had been trying to get baby to BF and stay latched most of shift. Mm has been sleepy, baby eager but will not maintain latch. Mom has flat nipples, large pendulum breast. Hand expression w. Nothing. Hand pump only available to stimulate breast and pull nipple out. Shells at bedside, mom to sick to put bra on. Wearing O2 mask d/t sats. Falls when she falls a sleep.  Mom very PALE!!! Her whole body is yellow pale, washed out looking and dark circles around her eyes. Lab work scheduled for this am.  Mom has DEBP, asked if she could get someone to bring to hospital d/t she needs to use it for stimulation, d/t her tiredness.  Baby isn't getting anything from breast, fussy, mouth dry. Discussed supplementing until she is able to latch baby and feeling better. Agreed. Feeding sheet according to hours of age given w/slow flow nipple Alimentum 20.  Baby has recessed chin, discussed with mom will probably need to do chin tug to obtain deeper latch for a while. Had uncoordinated suck at first, then suckled well.  FOB not assisting in care, covered his head while trying to get baby to latch. When I finished feeding baby, he sat up.  Patient Name: [name] [id number]'QToday's Date: 01/18/2015 Reason for consult: Follow-up assessment;Difficult latch   Maternal Data Does the patient have breastfeeding experience prior to this delivery?: No  Feeding Feeding Type: Formula Nipple Type: Slow - flow Length of feed: 15 min  LATCH Score/Interventions Latch: Repeated attempts needed to sustain latch, nipple held in mouth throughout feeding, stimulation needed to elicit sucking reflex. Intervention(s): Assist with latch;Adjust position;Breast massage;Breast compression  Audible Swallowing: None Intervention(s): Skin to skin;Hand expression Intervention(s): Hand expression;Alternate breast massage  Type of Nipple:  Flat Intervention(s): Shells;Hand pump  Comfort (Breast/Nipple): Soft / non-tender     Hold (Positioning): Full assist, staff holds infant at breast Intervention(s): Breastfeeding basics reviewed;Support Pillows;Position options;Skin to skin  LATCH Score: 4  Lactation Tools Discussed/Used Tools: Pump;Shells;Nipple [name] Shell Type: Inverted Breast pump type: Manual   Consult Status Consult Status: Follow-up Date: 01/18/15 Follow-up type: In-patient    [name], [name] 01/18/2015, 5:39 AM

## 2015-01-18 NOTE — Plan of Care (Signed)
Glucometer had pt. MR # but not pt. Name, Bld. Sugar was 88 and when glucometer was docked it charted in flowsheet the results of bld. Sugar.

## 2015-01-18 NOTE — Discharge Summary (Signed)
Obstetric Discharge Summary Reason for Admission: onset of labor Prenatal Procedures: NST Intrapartum Procedures: cesarean: low cervical, transverse Postpartum Procedures: none Complications-Operative and Postpartum: none HEMOGLOBIN  Date Value Ref Range Status  01/18/2015 7.9* 12.0 - 15.0 g/dL Final   HCT  Date Value Ref Range Status  01/18/2015 24.9* 36.0 - 46.0 % Final     Discharge Diagnoses: Term Pregnancy-delivered  Discharge Information: Date: 01/18/2015 Activity: pelvic rest Diet: routine Medications: Iron and Percocet Condition: stable Instructions: refer to practice specific booklet Discharge to: home Follow-up Information    Follow up with Marlow BaarsLARK, DYANNA, MD In 4 weeks.   Specialty:  Obstetrics   Contact information:   [address] [place] [address] [phone]       Newborn Data: Live born female  Birth Weight: 7 lb 2.8 oz (3255 g) APGAR: 7, 9  Home with mother.  Sireen Halk A 01/18/2015, 8:01 AM

## 2015-01-18 NOTE — Plan of Care (Signed)
Problem: Consults Goal: Diabetes Guidelines if Diabetic/Glucose > 140 If diabetic or lab glucose is > 140 mg/dl - Initiate Diabetes/Hyperglycemia Guidelines & Document Interventions  Outcome: Progressing FBS this am.

## 2015-01-19 LAB — GLUCOSE, CAPILLARY: Glucose-Capillary: 96 mg/dL (ref 70–99)

## 2015-01-19 NOTE — Progress Notes (Signed)
Patient is eating, ambulating, voiding.  Pain control is good.  Filed Vitals:   01/18/15 0900 01/18/15 1726 01/18/15 2030 01/19/15 0558  BP: 114/59 111/60  130/63  Pulse: 75 92  82  Temp: 98.5 F (36.9 C) 98.3 F (36.8 C)  98.1 F (36.7 C)  TempSrc: Oral Oral  Oral  Resp: 18 18  18   Height:      Weight:      SpO2: 95%  94%     Fundus firm Perineum without swelling.  Lab Results  Component Value Date   WBC 11.6* 01/18/2015   HGB 7.9* 01/18/2015   HCT 24.9* 01/18/2015   MCV 88.9 01/18/2015   PLT 178 01/18/2015    --/--/O POS, O POS (02/24 0920)/RI  A/P Post partum day 2.  Routine care.  Expect d/c today.   No s/s anemia- precautions given. Timithy Arons A

## 2015-01-19 NOTE — Lactation Note (Signed)
This note was copied from the chart of Jessica Jearld AdjutantRobyn Kelnhofer. Lactation Consultation Note  Follow up visit made prior to discharge.  Mom states baby has been nursing with nipple shield but also giving small amounts of formula supplementation.  Instructed to continue putting baby to breast with feeding cues and follow feedings with post pumping x 15-20 minutes.  If any milk obtained she will give it back to baby along with formula if needed for hunger.  Instructed to keep a feeding diary and to call for outpatient appointment next week.  Patient Name: [name] WUXLK'GToday'Obrien Date: 01/19/2015     Maternal Data    Feeding Feeding Type: Breast Fed Length of feed: 20 min  LATCH Score/Interventions                      Lactation Tools Discussed/Used     Consult Status      [name], [name] 01/19/2015, 2:17 PM

## 2015-01-19 NOTE — Plan of Care (Signed)
Pt. Called out wanting continuous pulse ox. Off, called Dr. Henderson CloudHorvath and Dorette Grateokayed to discontinue continuous Pulse ox.

## 2015-01-20 ENCOUNTER — Inpatient Hospital Stay (HOSPITAL_COMMUNITY)
Admission: AD | Admit: 2015-01-20 | Discharge: 2015-01-23 | DRG: 776 | Disposition: A | Discharge status: Home / Self Care | Payer: Managed Care, Other (non HMO) | Source: Ambulatory Visit | Attending: Obstetrics and Gynecology | Admitting: Obstetrics and Gynecology

## 2015-01-20 ENCOUNTER — Inpatient Hospital Stay (HOSPITAL_COMMUNITY): Payer: Managed Care, Other (non HMO)

## 2015-01-20 ENCOUNTER — Encounter (HOSPITAL_COMMUNITY): Payer: Self-pay | Admitting: *Deleted

## 2015-01-20 DIAGNOSIS — O99215 Obesity complicating the puerperium: Secondary | ICD-10-CM | POA: Diagnosis present

## 2015-01-20 DIAGNOSIS — R0602 Shortness of breath: Secondary | ICD-10-CM

## 2015-01-20 DIAGNOSIS — I5031 Acute diastolic (congestive) heart failure: Secondary | ICD-10-CM | POA: Diagnosis present

## 2015-01-20 DIAGNOSIS — Z885 Allergy status to narcotic agent status: Secondary | ICD-10-CM

## 2015-01-20 DIAGNOSIS — O2413 Pre-existing diabetes mellitus, type 2, in the puerperium: Secondary | ICD-10-CM | POA: Diagnosis present

## 2015-01-20 DIAGNOSIS — J45909 Unspecified asthma, uncomplicated: Secondary | ICD-10-CM | POA: Diagnosis present

## 2015-01-20 DIAGNOSIS — E119 Type 2 diabetes mellitus without complications: Secondary | ICD-10-CM | POA: Diagnosis present

## 2015-01-20 DIAGNOSIS — O99285 Endocrine, nutritional and metabolic diseases complicating the puerperium: Secondary | ICD-10-CM | POA: Diagnosis not present

## 2015-01-20 DIAGNOSIS — O9963 Diseases of the digestive system complicating the puerperium: Secondary | ICD-10-CM | POA: Diagnosis present

## 2015-01-20 DIAGNOSIS — Z9049 Acquired absence of other specified parts of digestive tract: Secondary | ICD-10-CM | POA: Diagnosis present

## 2015-01-20 DIAGNOSIS — O9943 Diseases of the circulatory system complicating the puerperium: Secondary | ICD-10-CM | POA: Diagnosis not present

## 2015-01-20 DIAGNOSIS — F419 Anxiety disorder, unspecified: Secondary | ICD-10-CM | POA: Diagnosis present

## 2015-01-20 DIAGNOSIS — Z79899 Other long term (current) drug therapy: Secondary | ICD-10-CM | POA: Diagnosis not present

## 2015-01-20 DIAGNOSIS — O99345 Other mental disorders complicating the puerperium: Secondary | ICD-10-CM | POA: Diagnosis present

## 2015-01-20 DIAGNOSIS — F329 Major depressive disorder, single episode, unspecified: Secondary | ICD-10-CM | POA: Diagnosis present

## 2015-01-20 DIAGNOSIS — I509 Heart failure, unspecified: Secondary | ICD-10-CM

## 2015-01-20 DIAGNOSIS — Z881 Allergy status to other antibiotic agents status: Secondary | ICD-10-CM

## 2015-01-20 DIAGNOSIS — O9081 Anemia of the puerperium: Secondary | ICD-10-CM | POA: Diagnosis present

## 2015-01-20 DIAGNOSIS — R197 Diarrhea, unspecified: Secondary | ICD-10-CM | POA: Diagnosis not present

## 2015-01-20 DIAGNOSIS — D649 Anemia, unspecified: Secondary | ICD-10-CM | POA: Diagnosis present

## 2015-01-20 DIAGNOSIS — I10 Essential (primary) hypertension: Secondary | ICD-10-CM | POA: Diagnosis present

## 2015-01-20 DIAGNOSIS — Z888 Allergy status to other drugs, medicaments and biological substances status: Secondary | ICD-10-CM | POA: Diagnosis not present

## 2015-01-20 DIAGNOSIS — E669 Obesity, unspecified: Secondary | ICD-10-CM | POA: Diagnosis present

## 2015-01-20 DIAGNOSIS — Z6841 Body Mass Index (BMI) 40.0 and over, adult: Secondary | ICD-10-CM | POA: Diagnosis not present

## 2015-01-20 DIAGNOSIS — E876 Hypokalemia: Secondary | ICD-10-CM | POA: Diagnosis not present

## 2015-01-20 DIAGNOSIS — O9989 Other specified diseases and conditions complicating pregnancy, childbirth and the puerperium: Secondary | ICD-10-CM | POA: Diagnosis present

## 2015-01-20 DIAGNOSIS — K589 Irritable bowel syndrome without diarrhea: Secondary | ICD-10-CM | POA: Diagnosis present

## 2015-01-20 DIAGNOSIS — K219 Gastro-esophageal reflux disease without esophagitis: Secondary | ICD-10-CM | POA: Diagnosis present

## 2015-01-20 DIAGNOSIS — Z23 Encounter for immunization: Secondary | ICD-10-CM | POA: Diagnosis not present

## 2015-01-20 DIAGNOSIS — O9953 Diseases of the respiratory system complicating the puerperium: Secondary | ICD-10-CM | POA: Diagnosis present

## 2015-01-20 LAB — CBC
HEMATOCRIT: 28.4 % — AB (ref 36.0–46.0)
HEMOGLOBIN: 8.9 g/dL — AB (ref 12.0–15.0)
MCH: 27.8 pg (ref 26.0–34.0)
MCHC: 31.3 g/dL (ref 30.0–36.0)
MCV: 88.8 fL (ref 78.0–100.0)
Platelets: 259 10*3/uL (ref 150–400)
RBC: 3.2 MIL/uL — ABNORMAL LOW (ref 3.87–5.11)
RDW: 18.2 % — ABNORMAL HIGH (ref 11.5–15.5)
WBC: 11.8 10*3/uL — ABNORMAL HIGH (ref 4.0–10.5)

## 2015-01-20 LAB — TYPE AND SCREEN
ABO/RH(D): O POS
Antibody Screen: NEGATIVE

## 2015-01-20 LAB — BRAIN NATRIURETIC PEPTIDE: B Natriuretic Peptide: 282.6 pg/mL — ABNORMAL HIGH (ref 0.0–100.0)

## 2015-01-20 MED ORDER — PROMETHAZINE HCL 25 MG/ML IJ SOLN: 12.5000 mg | Freq: Four times a day (QID) | INTRAMUSCULAR | Status: DC | PRN

## 2015-01-20 MED ORDER — FUROSEMIDE 40 MG PO TABS
40.0000 mg | ORAL_TABLET | Freq: Two times a day (BID) | ORAL | Status: DC
  Administered 2015-01-21 (×2): 40 mg via ORAL
  Filled 2015-01-20 (×3): qty 1

## 2015-01-20 MED ORDER — SODIUM CHLORIDE 0.9 % IV SOLN: 250.0000 mL | INTRAVENOUS | Status: DC | PRN

## 2015-01-20 MED ORDER — IBUPROFEN 600 MG PO TABS
600.0000 mg | ORAL_TABLET | Freq: Four times a day (QID) | ORAL | Status: DC
  Administered 2015-01-20 – 2015-01-23 (×12): 600 mg via ORAL
  Filled 2015-01-20 (×12): qty 1

## 2015-01-20 MED ORDER — POTASSIUM CHLORIDE ER 10 MEQ PO TBCR
10.0000 meq | EXTENDED_RELEASE_TABLET | Freq: Three times a day (TID) | ORAL | Status: DC
  Administered 2015-01-20: 10 meq via ORAL
  Filled 2015-01-20 (×2): qty 1

## 2015-01-20 MED ORDER — FUROSEMIDE 10 MG/ML IJ SOLN
40.0000 mg | Freq: Once | INTRAMUSCULAR | Status: AC
  Administered 2015-01-20: 40 mg via INTRAVENOUS
  Filled 2015-01-20: qty 4

## 2015-01-20 MED ORDER — OXYCODONE-ACETAMINOPHEN 5-325 MG PO TABS
2.0000 | ORAL_TABLET | Freq: Once | ORAL | Status: AC
  Administered 2015-01-20: 2 via ORAL
  Filled 2015-01-20: qty 2

## 2015-01-20 MED ORDER — IBUPROFEN 600 MG PO TABS
600.0000 mg | ORAL_TABLET | Freq: Once | ORAL | Status: AC
  Administered 2015-01-20: 600 mg via ORAL
  Filled 2015-01-20: qty 1

## 2015-01-20 MED ORDER — OXYCODONE-ACETAMINOPHEN 5-325 MG PO TABS
2.0000 | ORAL_TABLET | ORAL | Status: DC | PRN
  Administered 2015-01-20: 2 via ORAL
  Filled 2015-01-20: qty 2

## 2015-01-20 MED ORDER — PNEUMOCOCCAL VAC POLYVALENT 25 MCG/0.5ML IJ INJ
0.5000 mL | INJECTION | INTRAMUSCULAR | Status: AC
  Administered 2015-01-22: 0.5 mL via INTRAMUSCULAR
  Filled 2015-01-20 (×2): qty 0.5

## 2015-01-20 MED ORDER — FAMOTIDINE 20 MG PO TABS
20.0000 mg | ORAL_TABLET | Freq: Two times a day (BID) | ORAL | Status: DC
  Administered 2015-01-20 – 2015-01-23 (×6): 20 mg via ORAL
  Filled 2015-01-20 (×6): qty 1

## 2015-01-20 NOTE — Consult Note (Signed)
Referring Physician:  Norell Brisbin is an 34 y.o. female.                       Chief Complaint: Leg edema and shortness of breath  HPI: 34 year old female, G1, P1, A0, who is postop/postpartum day 3 presented with leg edema, dizziness and shortness of breath with chest x-ray showing cardiomegaly and perihilar edema. She has good diuresis with IV lasix and maitaining oxygenation saturation over 95 %.  Past Medical History  Diagnosis Date  . Allergy   . Arthritis   . Anxiety   . Asthma     exercise induced as child  . Depression   . GERD (gastroesophageal reflux disease)   . IBS (irritable bowel syndrome)   . Diabetes mellitus without complication   . Gestational diabetes 2016    glyburide      Past Surgical History  Procedure Laterality Date  . Cholecystectomy    . Ganglion cyst excision    . Appendectomy    . Cesarean section N/A 01/17/2015    Procedure: CESAREAN SECTION;  Surgeon: Marlow Baars, MD;  Location: [facility];  Service: Obstetrics;  Laterality: N/A;    Family History  Problem Relation Age of Onset  . Diabetes Mother   . Hypertension Mother   . Arthritis Mother   . Diabetes Father   . Heart disease Father   . Depression Father    Social History:  reports that she has never smoked. She has never used smokeless tobacco. She reports that she does not drink alcohol or use illicit drugs.  Allergies:  Allergies  Allergen Reactions  . Codeine Nausea And Vomiting  . Levaquin [Levofloxacin In D5w] Nausea And Vomiting  . Seroquel [Quetiapine Fumarate] Other (See Comments)    Reaction:  Heavy sedation   . Vicodin [Hydrocodone-Acetaminophen] Nausea And Vomiting  . Wellbutrin [Bupropion] Other (See Comments)    Reaction:  Seizures    Medications Prior to Admission  Medication Sig Dispense Refill  . acetaminophen (TYLENOL) 500 MG tablet Take 1,000 mg by mouth every 6 (six) hours as needed for moderate pain.    . calcium carbonate (TUMS - DOSED IN MG ELEMENTAL CALCIUM)  500 MG chewable tablet Chew 2 tablets by mouth 3 (three) times daily as needed for indigestion or heartburn.    . ferrous fumarate (HEMOCYTE - 106 MG FE) 325 (106 FE) MG TABS tablet Take 1 tablet by mouth daily.    Marland Kitchen ibuprofen (ADVIL,MOTRIN) 200 MG tablet Take 400 mg by mouth every 6 (six) hours as needed for headache or mild pain.    . Omega-3 Fatty Acids (FISH OIL) 1200 MG CAPS Take 1 capsule by mouth daily.    Marland Kitchen omeprazole (PRILOSEC) 40 MG capsule Take 40 mg by mouth daily.  1  . Polyethyl Glycol-Propyl Glycol (SYSTANE OP) Apply 1 drop to eye 2 (two) times daily.    . Prenat-FeFmCb-DSS-FA-DHA w/o A (CITRANATAL HARMONY) 27-1-260 MG CAPS Take 1 capsule by mouth daily.  11  . pyridOXINE (VITAMIN B-6) 100 MG tablet Take 100 mg by mouth daily.    . ranitidine (ZANTAC) 150 MG tablet Take 150 mg by mouth daily.  0  . sertraline (ZOLOFT) 50 MG tablet Take 50 mg by mouth daily.  1  . ACCU-CHEK FASTCLIX LANCETS MISC     . ACCU-CHEK SMARTVIEW test strip   2  . loratadine (CLARITIN) 10 MG tablet Take 10 mg by mouth daily as needed for allergies.     Marland Kitchen  ondansetron (ZOFRAN) 4 MG tablet Take 1 tablet (4 mg total) by mouth every 8 (eight) hours as needed for nausea or vomiting. (Patient not taking: Reported on 01/16/2015) 8 tablet 0  . oxyCODONE-acetaminophen (PERCOCET/ROXICET) 5-325 MG per tablet Take 1 tablet by mouth every 4 (four) hours as needed (for pain scale less than 7). 30 tablet 0    Results for orders placed or performed during the hospital encounter of 01/20/15 (from the past 48 hour(s))  Type and screen     Status: None   Collection Time: 01/20/15 11:55 AM  Result Value Ref Range   ABO/RH(D) O POS    Antibody Screen NEG    Sample Expiration 01/23/2015   CBC     Status: Abnormal   Collection Time: 01/20/15 11:55 AM  Result Value Ref Range   WBC 11.8 (H) 4.0 - 10.5 K/uL   RBC 3.20 (L) 3.87 - 5.11 MIL/uL   Hemoglobin 8.9 (L) 12.0 - 15.0 g/dL   HCT 40.928.4 (L) 81.136.0 - 91.446.0 %   MCV 88.8 78.0 -  100.0 fL   MCH 27.8 26.0 - 34.0 pg   MCHC 31.3 30.0 - 36.0 g/dL   RDW 78.218.2 (H) 95.611.5 - 21.315.5 %   Platelets 259 150 - 400 K/uL   Dg Chest 2 View  01/20/2015   CLINICAL DATA:  Pt presents to [facility] with complaints of weakness and shortness of breath. Cesarean section February the 25th. States she was told her HGB was 8 when discharged and was told to come in if she experienced any weakness of SOB. Her sob started last night. Non smoker  EXAM: CHEST  2 VIEW  COMPARISON:  Chest CT on 11/29/2014  FINDINGS: There is mild cardiomegaly. Perihilar edema and Kerley B-lines are noted. Small bilateral pleural effusions are present. More focal opacity in the right upper lobe is felt represent parenchymal opacity related to edema. Recent CT exam showed no suspicious nodule or mass in this region.  IMPRESSION: Findings consistent with congestive heart failure.   Electronically Signed   By: Norva PavlovElizabeth  Brown M.D.   On: 01/20/2015 13:37    Review Of Systems Constitutional: Positive for malaise/fatigue. Negative for fever and chills.  Respiratory: Positive for shortness of breath. Negative for cough and wheezing.  Cardiovascular: Positive for orthopnea and leg swelling. Negative for chest pain and palpitations.  Gastrointestinal: Positive for abdominal pain (Incisional). Negative for nausea and vomiting.  Musculoskeletal: Negative for myalgias.  Neurological: Positive for weakness. Negative for dizziness, focal weakness and headaches  Blood pressure 110/52, pulse 71, temperature 97.8 F (36.6 C), temperature source Oral, resp. rate 18, weight 116.62 kg (257 lb 1.6 oz), SpO2 98 %, unknown if currently breastfeeding. Constitutional: She is well-developed and well-nourished. Has mild respiratory distress.  HENT: Normocephalic, atrumatic.  Neck: Normal range of motion. Neck supple. JVD not visible. Cardiovascular: Normal rate and regular rhythm. Normal S1 and S2. II/VI systolic murmur.  Respiratory:  Bilateral basal crackles with mild respiratory distress. She has no wheezes.  GI: Soft. There is mild tenderness over lower abdomen. There is no rebound and no guarding.  Musculoskeletal: Normal range of motion. 1-2 + edema up to knees. Neurological: She is alert and oriented to person, place, and time. Moves all four extremities Skin: Skin is warm and dry.  Psychiatric: She has a normal mood and affect.   Assessment/Plan Acute left heart systolic failure. R/O diastolic failure Post partum cardiomyopathy.  Discussed limited fluid intake-about 50 % of usual intake or 1200  cc/day, low salt diet, daily weight, and taking medications regularly. She and husband also understood she may have to limit her activity for few months. She and husband understood water, ice, juice, coffee, soups and sodas all count as fluids. Will repeat echocardiogram in AM. Will change lasix to oral doses and add potassium supplementation. Will follow to adjust medications if needed.  Jessica Rodriguez, MD  01/20/2015, 9:21 PM

## 2015-01-20 NOTE — H&P (Signed)
Chief Complaint  Patient presents with  . Shortness of Breath  . Fatigue   For full details, please see NP note below.  This is a 34 yo G1P1001 with recent PIH and gestational diabetes who is postop/postpartum day 3 from LTCS after induction for ROM and arrest of active phase.  Pt actually had symptoms in the beginning of January and had a cardiology consult with Dr. Mayford Knifeurner on January 7 and a normal echo on January 13.  Pt had no further problems until immediately postoperative when she had some desaturations while sleeping.  Her O2 sats were corrected with O2 and her sats were normal while awake.  On Post op day two she was stable off O2 and not complaining of dizzyness, SOB or chest pain and she was discharged to home.  Today her husband called and informed me that she was feeling much worse and having dizzyness and SOB when trying to get out of bed.  She also had several episodes of diarrhea and dry heaves. I asked her to come to the hospital.  Her vitals and sats were stable off O2 but a chest X-ray was done which showed signs of congestive heart failure (Kerly b - lines, perihilar edema, bilateral pulmonary effusions.)  She was admitted and given lasix.  She currently feels better but has not done a lot of activity.  Filed Vitals:   01/20/15 1600 01/20/15 1700 01/20/15 1800 01/20/15 1900  BP:  123/65 108/72 112/83  Pulse:  58 72 74  Temp:  97.8 F (36.6 C)    TempSrc:  Oral    Resp:  19 21 17   Weight: 116.62 kg (257 lb 1.6 oz)     SpO2:  98% 100% 99%  on room air  EKG prelim borderline but NSR  Results for orders placed or performed during the hospital encounter of 01/20/15 (from the past 24 hour(s))  Type and screen     Status: None   Collection Time: 01/20/15 11:55 AM  Result Value Ref Range   ABO/RH(D) O POS    Antibody Screen NEG    Sample Expiration 01/23/2015   CBC     Status: Abnormal   Collection Time: 01/20/15 11:55 AM  Result Value Ref Range   WBC 11.8 (H) 4.0  - 10.5 K/uL   RBC 3.20 (L) 3.87 - 5.11 MIL/uL   Hemoglobin 8.9 (L) 12.0 - 15.0 g/dL   HCT 16.128.4 (L) 09.636.0 - 04.546.0 %   MCV 88.8 78.0 - 100.0 fL   MCH 27.8 26.0 - 34.0 pg   MCHC 31.3 30.0 - 36.0 g/dL   RDW 40.918.2 (H) 81.111.5 - 91.415.5 %   Platelets 259 150 - 400 K/uL    PMH significant for asthma, reflux.  A/P  POD #3 LTCS with possible congestive heart failure.  1.  Will give second dose of lasix tonight. 2.  Pt's vitals are stable. 3.  Consult cardiology. 4.  H/H is stable.  NPs note: HPI This is a 34 y.o. female who is 3 days postop from a Cesarean Delivery who presents with complaints of shortness of breath with any exertion. She also has weakness, and her belly "feels heavy". And she has some pain. Has not filled Percocet Rx yet. States had a cardiac workup a few weeks ago which was negative. That was done for chest pain.   Echocardiogram done on 12/05/14: Study Conclusions  - Left ventricle: The cavity size was normal. Wall thickness was normal. The estimated ejection fraction  was 55%. - Atrial septum: No defect or patent foramen ovale was identified.  RN Note:  Expand All Collapse All  Pt presents to MAU with complaints of weakness and shortness of breath. Cesarean section February the 25th. States she was told her HGB was 8 when discharged and was told to come in if she experienced any weakness of SOB        OB History    Gravida Para Term Preterm AB TAB SAB Ectopic Multiple Living   0 1        Past Medical History  Diagnosis Date  . Allergy   . Arthritis   . Anxiety   . Asthma     exercise induced as child  . Depression   . GERD (gastroesophageal reflux disease)   . IBS (irritable bowel syndrome)   . Diabetes mellitus without complication   . Gestational diabetes 2016    glyburide    Past Surgical History  Procedure Laterality Date  . Cholecystectomy    . Ganglion  cyst excision    . Appendectomy    . Cesarean section N/A 01/17/2015    Procedure: CESAREAN SECTION; Surgeon: Marlow Baars, MD; Location: [facility]; Service: Obstetrics; Laterality: N/A;    Family History  Problem Relation Age of Onset  . Diabetes Mother   . Hypertension Mother   . Arthritis Mother   . Diabetes Father   . Heart disease Father   . Depression Father     History  Substance Use Topics  . Smoking status: Never Smoker   . Smokeless tobacco: Never Used  . Alcohol Use: No    Allergies:  Allergies  Allergen Reactions  . Codeine Nausea And Vomiting  . Levaquin [Levofloxacin In D5w] Nausea And Vomiting  . Seroquel [Quetiapine Fumarate] Other (See Comments)    Reaction: Heavy sedation   . Vicodin [Hydrocodone-Acetaminophen] Nausea And Vomiting  . Wellbutrin [Bupropion] Other (See Comments)    Reaction: Seizures    Prescriptions prior to admission  Medication Sig Dispense Refill Last Dose  . acetaminophen (TYLENOL) 500 MG tablet Take 1,000 mg by mouth every 6 (six) hours as needed for moderate pain.   Past Week at Unknown time  . calcium carbonate (TUMS - DOSED IN MG ELEMENTAL CALCIUM) 500 MG chewable tablet Chew 2 tablets by mouth 3 (three) times daily as needed for indigestion or heartburn.   Past Week at Unknown time  . ferrous fumarate (HEMOCYTE - 106 MG FE) 325 (106 FE) MG TABS tablet Take 1 tablet by mouth daily.   01/20/2015 at Unknown time  . ibuprofen (ADVIL,MOTRIN) 200 MG tablet Take 400 mg by mouth every 6 (six) hours as needed for headache or mild pain.   01/19/2015 at 2300  . Omega-3 Fatty Acids (FISH OIL) 1200 MG CAPS Take 1 capsule by mouth daily.   01/20/2015 at Unknown time  . omeprazole (PRILOSEC) 40 MG capsule Take 40 mg by mouth daily.  1 01/20/2015 at Unknown time  . Polyethyl Glycol-Propyl Glycol (SYSTANE OP) Apply 1 drop to  eye 2 (two) times daily.   01/20/2015 at Unknown time  . Prenat-FeFmCb-DSS-FA-DHA w/o A (CITRANATAL HARMONY) 27-1-260 MG CAPS Take 1 capsule by mouth daily.  11 01/20/2015 at Unknown time  . pyridOXINE (VITAMIN B-6) 100 MG tablet Take 100 mg by mouth daily.   01/19/2015 at Unknown time  . ranitidine (ZANTAC) 150 MG tablet Take 150 mg by mouth daily.  0 01/19/2015 at  Unknown time  . sertraline (ZOLOFT) 50 MG tablet Take 50 mg by mouth daily.  1 01/20/2015 at Unknown time  . ACCU-CHEK FASTCLIX LANCETS MISC      . ACCU-CHEK SMARTVIEW test strip   2   . loratadine (CLARITIN) 10 MG tablet Take 10 mg by mouth daily as needed for allergies.    PRN  . ondansetron (ZOFRAN) 4 MG tablet Take 1 tablet (4 mg total) by mouth every 8 (eight) hours as needed for nausea or vomiting. (Patient not taking: Reported on 01/16/2015) 8 tablet 0 Not Taking at Unknown time  . oxyCODONE-acetaminophen (PERCOCET/ROXICET) 5-325 MG per tablet Take 1 tablet by mouth every 4 (four) hours as needed (for pain scale less than 7). 30 tablet 0 RX not picked up yet    Review of Systems  Constitutional: Positive for malaise/fatigue. Negative for fever and chills.  Respiratory: Positive for shortness of breath. Negative for cough and wheezing.  Cardiovascular: Positive for orthopnea and leg swelling. Negative for chest pain and palpitations.  Gastrointestinal: Positive for abdominal pain (Incisional). Negative for nausea and vomiting.  Musculoskeletal: Negative for myalgias.  Neurological: Positive for weakness. Negative for dizziness, focal weakness and headaches.   Physical Exam   Blood pressure 149/71, pulse 66, temperature 99.4 F (37.4 C), SpO2 100 %, unknown if currently breastfeeding. Filed Vitals:   01/20/15 1033 01/20/15 1148 01/20/15 1149 01/20/15 1151  BP:  145/82 135/71 133/67  Pulse:  74 74 76  Temp:      SpO2: 100%       Physical  Exam  Constitutional: She is oriented to person, place, and time. She appears well-developed and well-nourished. No distress (but uncomfortable appearing).  HENT:  Head: Normocephalic.  Neck: Normal range of motion. Neck supple.  Cardiovascular: Normal rate and regular rhythm. Exam reveals no gallop and no friction rub.  Murmur (?soft systolic) heard. Respiratory: Effort normal and breath sounds normal. No respiratory distress. She has no wheezes (slight decrease in bases). She has no rales. She exhibits no tenderness.  GI: Soft. There is tenderness (over incision). There is no rebound and no guarding.  Genitourinary: Vaginal discharge (small lochia) found.  Musculoskeletal: Normal range of motion.  Neurological: She is alert and oriented to person, place, and time.  Skin: Skin is warm and dry.  Psychiatric: She has a normal mood and affect.    MAU Course  Procedures  MDM  Lab Results Last 24 Hours    Results for orders placed or performed during the hospital encounter of 01/20/15 (from the past 24 hour(s))  Type and screen Status: None   Collection Time: 01/20/15 11:55 AM  Result Value Ref Range   ABO/RH(D) O POS    Antibody Screen NEG    Sample Expiration 01/23/2015   CBC Status: Abnormal   Collection Time: 01/20/15 11:55 AM  Result Value Ref Range   WBC 11.8 (H) 4.0 - 10.5 K/uL   RBC 3.20 (L) 3.87 - 5.11 MIL/uL   Hemoglobin 8.9 (L) 12.0 - 15.0 g/dL   HCT 13.2 (L) 44.0 - 10.2 %   MCV 88.8 78.0 - 100.0 fL   MCH 27.8 26.0 - 34.0 pg   MCHC 31.3 30.0 - 36.0 g/dL   RDW 72.5 (H) 36.6 - 44.0 %   Platelets 259 150 - 400 K/uL     EKG: NSR with Rightward Axis   Imaging Results    Dg Chest 2 View  01/20/2015 CLINICAL DATA: Pt presents to MAU with complaints of  weakness and shortness of breath. Cesarean section February the 25th. States she was told her HGB was 8 when discharged and was told to come in  if she experienced any weakness of SOB. Her sob started last night. Non smoker EXAM: CHEST 2 VIEW COMPARISON: Chest CT on 11/29/2014 FINDINGS: There is mild cardiomegaly. Perihilar edema and Kerley B-lines are noted. Small bilateral pleural effusions are present. More focal opacity in the right upper lobe is felt represent parenchymal opacity related to edema. Recent CT exam showed no suspicious nodule or mass in this region. IMPRESSION: Findings consistent with congestive heart failure. Electronically Signed By: Norva Pavlov M.D. On: 01/20/2015 13:37       Assessment and Plan  A: 3 days Postpartum/Postoperative  Shortness of breath, Congestive Heart Failure  Breastfeeding  Anemia, improved  P: Discussed with Dr Henderson Cloud  Admit to [facility]  Lasix  Cardiology consult

## 2015-01-20 NOTE — MAU Provider Note (Signed)
History     CSN: [id number]  Arrival date and time: 01/20/15 1011   None     Chief Complaint  Patient presents with  . Shortness of Breath  . Fatigue   HPI This is a 34 y.o. female who is 3 days postop from a Cesarean Delivery who presents with complaints of shortness of breath with any exertion.  She also has weakness, and her belly "feels heavy".  And she has some pain. Has not filled Percocet Rx yet. States had a cardiac workup a few weeks ago which was negative. That was done for chest pain.   Echocardiogram done on 12/05/14: Study Conclusions  - Left ventricle: The cavity size was normal. Wall thickness was normal. The estimated ejection fraction was 55%. - Atrial septum: No defect or patent foramen ovale was identified.  RN Note:   Expand All Collapse All   Pt presents to MAU with complaints of weakness and shortness of breath. Cesarean section February the 25th. States she was told her HGB was 8 when discharged and was told to come in if she experienced any weakness of SOB        OB History    Gravida Para Term Preterm AB TAB SAB Ectopic Multiple Living   0 1      Past Medical History  Diagnosis Date  . Allergy   . Arthritis   . Anxiety   . Asthma     exercise induced as child  . Depression   . GERD (gastroesophageal reflux disease)   . IBS (irritable bowel syndrome)   . Diabetes mellitus without complication   . Gestational diabetes 2016    glyburide    Past Surgical History  Procedure Laterality Date  . Cholecystectomy    . Ganglion cyst excision    . Appendectomy    . Cesarean section N/A 01/17/2015    Procedure: CESAREAN SECTION;  Surgeon: Marlow Baars, MD;  Location: [facility];  Service: Obstetrics;  Laterality: N/A;    Family History  Problem Relation Age of Onset  . Diabetes Mother   . Hypertension Mother   . Arthritis Mother   . Diabetes Father   . Heart disease Father   . Depression Father     History  Substance Use  Topics  . Smoking status: Never Smoker   . Smokeless tobacco: Never Used  . Alcohol Use: No    Allergies:  Allergies  Allergen Reactions  . Codeine Nausea And Vomiting  . Levaquin [Levofloxacin In D5w] Nausea And Vomiting  . Seroquel [Quetiapine Fumarate] Other (See Comments)    Reaction:  Heavy sedation   . Vicodin [Hydrocodone-Acetaminophen] Nausea And Vomiting  . Wellbutrin [Bupropion] Other (See Comments)    Reaction:  Seizures    Prescriptions prior to admission  Medication Sig Dispense Refill Last Dose  . acetaminophen (TYLENOL) 500 MG tablet Take 1,000 mg by mouth every 6 (six) hours as needed for moderate pain.   Past Week at Unknown time  . calcium carbonate (TUMS - DOSED IN MG ELEMENTAL CALCIUM) 500 MG chewable tablet Chew 2 tablets by mouth 3 (three) times daily as needed for indigestion or heartburn.   Past Week at Unknown time  . ferrous fumarate (HEMOCYTE - 106 MG FE) 325 (106 FE) MG TABS tablet Take 1 tablet by mouth daily.   01/20/2015 at Unknown time  . ibuprofen (ADVIL,MOTRIN) 200 MG tablet Take 400 mg by mouth every 6 (six) hours  as needed for headache or mild pain.   01/19/2015 at 2300  . Omega-3 Fatty Acids (FISH OIL) 1200 MG CAPS Take 1 capsule by mouth daily.   01/20/2015 at Unknown time  . omeprazole (PRILOSEC) 40 MG capsule Take 40 mg by mouth daily.  1 01/20/2015 at Unknown time  . Polyethyl Glycol-Propyl Glycol (SYSTANE OP) Apply 1 drop to eye 2 (two) times daily.   01/20/2015 at Unknown time  . Prenat-FeFmCb-DSS-FA-DHA w/o A (CITRANATAL HARMONY) 27-1-260 MG CAPS Take 1 capsule by mouth daily.  11 01/20/2015 at Unknown time  . pyridOXINE (VITAMIN B-6) 100 MG tablet Take 100 mg by mouth daily.   01/19/2015 at Unknown time  . ranitidine (ZANTAC) 150 MG tablet Take 150 mg by mouth daily.  0 01/19/2015 at Unknown time  . sertraline (ZOLOFT) 50 MG tablet Take 50 mg by mouth daily.  1 01/20/2015 at Unknown time  . ACCU-CHEK FASTCLIX LANCETS MISC      . ACCU-CHEK SMARTVIEW  test strip   2   . loratadine (CLARITIN) 10 MG tablet Take 10 mg by mouth daily as needed for allergies.    PRN  . ondansetron (ZOFRAN) 4 MG tablet Take 1 tablet (4 mg total) by mouth every 8 (eight) hours as needed for nausea or vomiting. (Patient not taking: Reported on 01/16/2015) 8 tablet 0 Not Taking at Unknown time  . oxyCODONE-acetaminophen (PERCOCET/ROXICET) 5-325 MG per tablet Take 1 tablet by mouth every 4 (four) hours as needed (for pain scale less than 7). 30 tablet 0 RX not picked up yet    Review of Systems  Constitutional: Positive for malaise/fatigue. Negative for fever and chills.  Respiratory: Positive for shortness of breath. Negative for cough and wheezing.   Cardiovascular: Positive for orthopnea and leg swelling. Negative for chest pain and palpitations.  Gastrointestinal: Positive for abdominal pain (Incisional). Negative for nausea and vomiting.  Musculoskeletal: Negative for myalgias.  Neurological: Positive for weakness. Negative for dizziness, focal weakness and headaches.   Physical Exam   Blood pressure 149/71, pulse 66, temperature 99.4 F (37.4 C), SpO2 100 %, unknown if currently breastfeeding. Filed Vitals:   01/20/15 1033 01/20/15 1148 01/20/15 1149 01/20/15 1151  BP:  145/82 135/71 133/67  Pulse:  74 74 76  Temp:      SpO2: 100%       Physical Exam  Constitutional: She is oriented to person, place, and time. She appears well-developed and well-nourished. No distress (but uncomfortable appearing).  HENT:  Head: Normocephalic.  Neck: Normal range of motion. Neck supple.  Cardiovascular: Normal rate and regular rhythm.  Exam reveals no gallop and no friction rub.   Murmur (?soft systolic) heard. Respiratory: Effort normal and breath sounds normal. No respiratory distress. She has no wheezes (slight decrease in bases). She has no rales. She exhibits no tenderness.  GI: Soft. There is tenderness (over incision). There is no rebound and no guarding.   Genitourinary: Vaginal discharge (small lochia) found.  Musculoskeletal: Normal range of motion.  Neurological: She is alert and oriented to person, place, and time.  Skin: Skin is warm and dry.  Psychiatric: She has a normal mood and affect.    MAU Course  Procedures  MDM Results for orders placed or performed during the hospital encounter of 01/20/15 (from the past 24 hour(s))  Type and screen     Status: None   Collection Time: 01/20/15 11:55 AM  Result Value Ref Range   ABO/RH(D) O POS    Antibody  Screen NEG    Sample Expiration 01/23/2015   CBC     Status: Abnormal   Collection Time: 01/20/15 11:55 AM  Result Value Ref Range   WBC 11.8 (H) 4.0 - 10.5 K/uL   RBC 3.20 (L) 3.87 - 5.11 MIL/uL   Hemoglobin 8.9 (L) 12.0 - 15.0 g/dL   HCT 40.928.4 (L) 81.136.0 - 91.446.0 %   MCV 88.8 78.0 - 100.0 fL   MCH 27.8 26.0 - 34.0 pg   MCHC 31.3 30.0 - 36.0 g/dL   RDW 78.218.2 (H) 95.611.5 - 21.315.5 %   Platelets 259 150 - 400 K/uL   EKG:  NSR with Rightward Axis  Dg Chest 2 View  01/20/2015   CLINICAL DATA:  Pt presents to MAU with complaints of weakness and shortness of breath. Cesarean section February the 25th. States she was told her HGB was 8 when discharged and was told to come in if she experienced any weakness of SOB. Her sob started last night. Non smoker  EXAM: CHEST  2 VIEW  COMPARISON:  Chest CT on 11/29/2014  FINDINGS: There is mild cardiomegaly. Perihilar edema and Kerley B-lines are noted. Small bilateral pleural effusions are present. More focal opacity in the right upper lobe is felt represent parenchymal opacity related to edema. Recent CT exam showed no suspicious nodule or mass in this region.  IMPRESSION: Findings consistent with congestive heart failure.   Electronically Signed   By: Norva PavlovElizabeth  Brown M.D.   On: 01/20/2015 13:37      Assessment and Plan  A:  3 days Postpartum/Postoperative       Shortness of breath, Congestive Heart Failure       Breastfeeding       Anemia,  improved  P:  Discussed with Dr Henderson CloudHorvath       [facility]       Las[facility]        Cardiology consult          [place] CenterWILLIAMS,MARIE 01/20/2015, 11:42 AM

## 2015-01-20 NOTE — MAU Note (Signed)
Pt presents to MAU with complaints of weakness and shortness of breath. Cesarean section February the 25th. States she was told her HGB was 8 when discharged and was told to come in if she experienced any weakness of SOB

## 2015-01-20 NOTE — MAU Note (Signed)
Report given to receiving nurse Paige in AICU.

## 2015-01-20 NOTE — Progress Notes (Signed)
eLink Physician-Brief Progress Note Patient Name: Jessica AdjutantRobyn Obrien DOB: 12/01/1980 MRN: [id number]   Date of Service  01/20/2015  HPI/Events of Note  Recently post partum pt admitted with dyspnea what appears to be mild pulm edema on CXR. Presently in no distress on RA. Admitting MD has requested some guidance re: further diuresis. EKG reviewed and is unremarkable  eICU Interventions  1) Would hold further Lasix for now 2) Add BNP on ot admission labs 3) Repeat BNP in AM 2/29 4) recheck CXR in AM to determine if further diuresis is indicated 5) Cards consultation has been requested. Recent Echocardiogram last month revealed LVEF 55%. I will let Cards decide on possible repeat echocardiogram     Intervention Category Minor Interventions: Other:  Billy FischerDavid Victoriah Wilds 01/20/2015, 7:45 PM

## 2015-01-21 LAB — TROPONIN I: Troponin I: 0.05 ng/mL — ABNORMAL HIGH (ref <0.031)

## 2015-01-21 LAB — BASIC METABOLIC PANEL
Anion gap: 2 — ABNORMAL LOW (ref 5–15)
BUN: 7 mg/dL (ref 6–23)
CHLORIDE: 110 mmol/L (ref 96–112)
CO2: 26 mmol/L (ref 19–32)
Calcium: 7.7 mg/dL — ABNORMAL LOW (ref 8.4–10.5)
Creatinine, Ser: 0.52 mg/dL (ref 0.50–1.10)
GFR calc Af Amer: >90 mL/min (ref >90)
Glucose, Bld: 84 mg/dL (ref 70–99)
Potassium: 3.3 mmol/L — ABNORMAL LOW (ref 3.5–5.1)
SODIUM: 138 mmol/L (ref 135–145)

## 2015-01-21 LAB — CBC
HCT: 26.2 % — ABNORMAL LOW (ref 36.0–46.0)
HEMOGLOBIN: 8.4 g/dL — AB (ref 12.0–15.0)
MCH: 28.4 pg (ref 26.0–34.0)
MCHC: 32.1 g/dL (ref 30.0–36.0)
MCV: 88.5 fL (ref 78.0–100.0)
PLATELETS: 235 10*3/uL (ref 150–400)
RBC: 2.96 MIL/uL — AB (ref 3.87–5.11)
RDW: 18 % — AB (ref 11.5–15.5)
WBC: 9.5 10*3/uL (ref 4.0–10.5)

## 2015-01-21 LAB — BRAIN NATRIURETIC PEPTIDE: B Natriuretic Peptide: 224 pg/mL — ABNORMAL HIGH (ref 0.0–100.0)

## 2015-01-21 MED ORDER — SERTRALINE HCL 50 MG PO TABS
50.0000 mg | ORAL_TABLET | Freq: Every day | ORAL | Status: DC
  Administered 2015-01-21 – 2015-01-23 (×3): 50 mg via ORAL
  Filled 2015-01-21 (×4): qty 1

## 2015-01-21 MED ORDER — FUROSEMIDE 20 MG PO TABS
20.0000 mg | ORAL_TABLET | Freq: Every day | ORAL | Status: DC
  Administered 2015-01-22 – 2015-01-23 (×2): 20 mg via ORAL
  Filled 2015-01-21 (×3): qty 1

## 2015-01-21 MED ORDER — LORAZEPAM 1 MG PO TABS
0.5000 mg | ORAL_TABLET | ORAL | Status: DC | PRN
  Administered 2015-01-21 – 2015-01-22 (×3): 0.5 mg via ORAL
  Filled 2015-01-21 (×4): qty 1

## 2015-01-21 MED ORDER — LABETALOL HCL 100 MG PO TABS
100.0000 mg | ORAL_TABLET | Freq: Two times a day (BID) | ORAL | Status: DC
  Administered 2015-01-21 – 2015-01-23 (×5): 100 mg via ORAL
  Filled 2015-01-21 (×7): qty 1

## 2015-01-21 MED ORDER — POTASSIUM CHLORIDE ER 10 MEQ PO TBCR
20.0000 meq | EXTENDED_RELEASE_TABLET | Freq: Three times a day (TID) | ORAL | Status: DC
  Administered 2015-01-21 – 2015-01-22 (×6): 20 meq via ORAL
  Filled 2015-01-21 (×8): qty 2

## 2015-01-21 NOTE — Progress Notes (Signed)
UR chart review completed.  

## 2015-01-21 NOTE — Progress Notes (Signed)
  Echocardiogram 2D Echocardiogram has been performed.  Arvil ChacoFoster, Wrenley Sayed 01/21/2015, 4:53 PM

## 2015-01-21 NOTE — Progress Notes (Signed)
Subjective: Postpartum Day 4: Cesarean Delivery  HD #2 for re-admission for shortness of breath and diagnosis of post partum CHF And fluid overload. Cardiology was consulted and was present at bedside this am Patient reports incisional pain and feeling anxious. She is tearful and worried.    Patient Active Problem List   Diagnosis Date Noted  . Congestive heart failure 01/20/2015  . Gestational hypertension, antepartum 01/16/2015  . GERD (gastroesophageal reflux disease) 12/24/2014  . Chest pain 11/29/2014  . DOE (dyspnea on exertion) 11/29/2014  . BMI 37.0-37.9, adult 10/03/2013  . Depression 04/16/2013   Past Medical History  Diagnosis Date  . Allergy   . Arthritis   . Anxiety   . Asthma     exercise induced as child  . Depression   . GERD (gastroesophageal reflux disease)   . IBS (irritable bowel syndrome)   . Diabetes mellitus without complication   . Gestational diabetes 2016    glyburide    Past Surgical History  Procedure Laterality Date  . Cholecystectomy    . Ganglion cyst excision    . Appendectomy    . Cesarean section N/A 01/17/2015    Procedure: CESAREAN SECTION;  Surgeon: Marlow Baarsyanna Clark, MD;  Location: [facility];  Service: Obstetrics;  Laterality: N/A;    Prescriptions prior to admission  Medication Sig Dispense Refill Last Dose  . acetaminophen (TYLENOL) 500 MG tablet Take 1,000 mg by mouth every 6 (six) hours as needed for moderate pain.   Past Week at Unknown time  . calcium carbonate (TUMS - DOSED IN MG ELEMENTAL CALCIUM) 500 MG chewable tablet Chew 2 tablets by mouth 3 (three) times daily as needed for indigestion or heartburn.   Past Week at Unknown time  . ferrous fumarate (HEMOCYTE - 106 MG FE) 325 (106 FE) MG TABS tablet Take 1 tablet by mouth daily.   01/20/2015 at Unknown time  . ibuprofen (ADVIL,MOTRIN) 200 MG tablet Take 400 mg by mouth every 6 (six) hours as needed for headache or mild pain.   01/19/2015 at 2300  . Omega-3 Fatty Acids (FISH OIL) 1200 MG  CAPS Take 1 capsule by mouth daily.   01/20/2015 at Unknown time  . omeprazole (PRILOSEC) 40 MG capsule Take 40 mg by mouth daily.  1 01/20/2015 at Unknown time  . Polyethyl Glycol-Propyl Glycol (SYSTANE OP) Apply 1 drop to eye 2 (two) times daily.   01/20/2015 at Unknown time  . Prenat-FeFmCb-DSS-FA-DHA w/o A (CITRANATAL HARMONY) 27-1-260 MG CAPS Take 1 capsule by mouth daily.  11 01/20/2015 at Unknown time  . pyridOXINE (VITAMIN B-6) 100 MG tablet Take 100 mg by mouth daily.   01/19/2015 at Unknown time  . ranitidine (ZANTAC) 150 MG tablet Take 150 mg by mouth daily.  0 01/19/2015 at Unknown time  . sertraline (ZOLOFT) 50 MG tablet Take 50 mg by mouth daily.  1 01/20/2015 at Unknown time  . ACCU-CHEK FASTCLIX LANCETS MISC      . ACCU-CHEK SMARTVIEW test strip   2   . loratadine (CLARITIN) 10 MG tablet Take 10 mg by mouth daily as needed for allergies.    PRN  . ondansetron (ZOFRAN) 4 MG tablet Take 1 tablet (4 mg total) by mouth every 8 (eight) hours as needed for nausea or vomiting. (Patient not taking: Reported on 01/16/2015) 8 tablet 0 Not Taking at Unknown time  . oxyCODONE-acetaminophen (PERCOCET/ROXICET) 5-325 MG per tablet Take 1 tablet by mouth every 4 (four) hours as needed (for pain scale less than  7). 30 tablet 0 RX not picked up yet   Allergies  Allergen Reactions  . Codeine Nausea And Vomiting  . Levaquin [Levofloxacin In D5w] Nausea And Vomiting  . Seroquel [Quetiapine Fumarate] Other (See Comments)    Reaction:  Heavy sedation   . Vicodin [Hydrocodone-Acetaminophen] Nausea And Vomiting  . Wellbutrin [Bupropion] Other (See Comments)    Reaction:  Seizures    History  Substance Use Topics  . Smoking status: Never Smoker   . Smokeless tobacco: Never Used  . Alcohol Use: No    Family History  Problem Relation Age of Onset  . Diabetes Mother   . Hypertension Mother   . Arthritis Mother   . Diabetes Father   . Heart disease Father   . Depression Father    Objective: Vital  signs in last 24 hours: Temp:  [97.8 F (36.6 C)-99.1 F (37.3 C)] 99.1 F (37.3 C) (02/29 0805) Pulse Rate:  [57-95] 95 (02/29 1100) Resp:  [13-22] 18 (02/29 1100) BP: (108-159)/(52-83) 122/57 mmHg (02/29 1100) SpO2:  [95 %-100 %] 96 % (02/29 1100) Weight:  [116.62 kg (257 lb 1.6 oz)-117.209 kg (258 lb 6.4 oz)] 117.209 kg (258 lb 6.4 oz) (02/29 0400)   Intake/Output last 3 shifts: I/O last 3 completed shifts: In: 2106.7 [P.O.:1760; I.V.:346.7] Out: 6425 [Urine:6425] Intake/Output this shift:  Physical Exam:  General: alert, cooperative and mild distress Lochia: appropriate Uterine Fundus: firm Incision: healing well, no significant drainage, no dehiscence DVT Evaluation: No evidence of DVT seen on physical exam. Negative Homan's sign. No cords or calf tenderness.   Recent Labs  01/20/15 1155 01/21/15 0520  HGB 8.9* 8.4*  HCT 28.4* 26.2*     Assessment/Plan:  Active Problems:   1. 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           Echo today shows normal EF and preservation of systolic function there was         Moderate hypokinesis of the inferoseptal myocardium.           Per Cards will continue lasix, as patient is diuresing well.          Patient is on fluid restriction.  NS fluids running this am for [facility] - will stop.   2. BP elevated last 6-12 hours     Labetalol  BID added to regimen     No s/sx of preeclampsia, will closely monitor  3. Post op c-section - post operatively no issues normal post op pain, alleviated with prn percocet  4. Anxiety - will add prn ativan and restart her Zoloft    Masaye Gatchalian STACIA

## 2015-01-21 NOTE — Progress Notes (Signed)
Nutrition Education Note  RD consulted for nutrition education regarding new onset CHF.  RD provided "Low Sodium Nutrition Therapy" handout from the Academy of [employer]. Fluid restriction handout provided.  Reviewed patient's dietary recall. Provided examples on ways to decrease sodium intake in diet. Discouraged intake of processed foods and use of salt shaker. Encouraged fresh fruits and vegetables as well as whole grain sources of carbohydrates to maximize fiber intake.   RD discussed why it is important for patient to adhere to diet recommendations, and emphasized the role of fluids, foods to avoid.  Teach back method used.  Expect good compliance.  Current diet order is 2 sodium restriction, plus 1200 ml fluid restriction.. Labs and medications reviewed. No further nutrition interventions warranted at this time.. If additional nutrition issues arise, please re-consult RD.   Elisabeth CaraKatherine Jerrelle Michelsen M.Odis LusterEd. R.D. LDN Neonatal Nutrition Support Specialist/RD III Pager [phone]

## 2015-01-22 ENCOUNTER — Encounter: Payer: Managed Care, Other (non HMO) | Admitting: Physical Therapy

## 2015-01-22 LAB — BASIC METABOLIC PANEL
ANION GAP: 7 (ref 5–15)
BUN: 8 mg/dL (ref 6–23)
CHLORIDE: 107 mmol/L (ref 96–112)
CO2: 23 mmol/L (ref 19–32)
Calcium: 8.1 mg/dL — ABNORMAL LOW (ref 8.4–10.5)
Creatinine, Ser: 0.58 mg/dL (ref 0.50–1.10)
GFR calc Af Amer: >90 mL/min (ref >90)
GFR calc non Af Amer: >90 mL/min (ref >90)
Glucose, Bld: 84 mg/dL (ref 70–99)
Potassium: 3.4 mmol/L — ABNORMAL LOW (ref 3.5–5.1)
SODIUM: 137 mmol/L (ref 135–145)

## 2015-01-22 LAB — CBC
HCT: 27.8 % — ABNORMAL LOW (ref 36.0–46.0)
Hemoglobin: 8.7 g/dL — ABNORMAL LOW (ref 12.0–15.0)
MCH: 27.7 pg (ref 26.0–34.0)
MCHC: 31.3 g/dL (ref 30.0–36.0)
MCV: 88.5 fL (ref 78.0–100.0)
Platelets: 283 10*3/uL (ref 150–400)
RBC: 3.14 MIL/uL — ABNORMAL LOW (ref 3.87–5.11)
RDW: 18.2 % — ABNORMAL HIGH (ref 11.5–15.5)
WBC: 9.5 10*3/uL (ref 4.0–10.5)

## 2015-01-22 LAB — TROPONIN I
Troponin I: <0.03 ng/mL (ref <0.031)
Troponin I: <0.03 ng/mL (ref <0.031)

## 2015-01-22 MED ORDER — FUROSEMIDE 40 MG PO TABS
40.0000 mg | ORAL_TABLET | Freq: Once | ORAL | Status: AC
  Administered 2015-01-22: 40 mg via ORAL
  Filled 2015-01-22: qty 1

## 2015-01-22 MED ORDER — MAGNESIUM OXIDE 400 (241.3 MG) MG PO TABS
400.0000 mg | ORAL_TABLET | Freq: Every day | ORAL | Status: DC
  Administered 2015-01-22 – 2015-01-23 (×2): 400 mg via ORAL
  Filled 2015-01-22 (×3): qty 1

## 2015-01-22 NOTE — Progress Notes (Signed)
Pt is feeling much better. She has no SOB. Her VS are stable. She is ready for D/C but cardiology who like to wait for her K+ to improve.  Will likely discharge in am.

## 2015-01-22 NOTE — Progress Notes (Addendum)
Dr. Algie CofferKadakia, Cardiology, notified of lab results.  Stated that he wants to keep the patient another night.  States that he will continue the Potassium and will be ordering Magnesium.  Continue cardiac monitoring.  Stated patient will possibly be discharged home tomorrow, if potassium is in normal range.   1425 - Dr. Algie CofferKadakia stated patient may ambulate on floor without telemetry.

## 2015-01-22 NOTE — Progress Notes (Signed)
1755 - Patient reported to RN 3 episodes of diarrhea today.  Patient reports feeling dizzy after the last episode.  VS stable.  States that she is not sure if it is "the medications or all that has been going on."  C Diff protocol initiated.  Placed on enteric precautions.  Dr. Dareen PianoAnderson notified.

## 2015-01-23 LAB — BASIC METABOLIC PANEL
ANION GAP: 10 (ref 5–15)
BUN: 9 mg/dL (ref 6–23)
CHLORIDE: 108 mmol/L (ref 96–112)
CO2: 24 mmol/L (ref 19–32)
Calcium: 8.2 mg/dL — ABNORMAL LOW (ref 8.4–10.5)
Creatinine, Ser: 0.59 mg/dL (ref 0.50–1.10)
GFR calc Af Amer: >90 mL/min (ref >90)
GFR calc non Af Amer: >90 mL/min (ref >90)
GLUCOSE: 92 mg/dL (ref 70–99)
POTASSIUM: 4.2 mmol/L (ref 3.5–5.1)
SODIUM: 142 mmol/L (ref 135–145)

## 2015-01-23 LAB — CLOSTRIDIUM DIFFICILE BY PCR: CDIFFPCR: NEGATIVE

## 2015-01-23 MED ORDER — LORAZEPAM 0.5 MG PO TABS: 0.5000 mg | ORAL_TABLET | ORAL | Status: AC | PRN

## 2015-01-23 MED ORDER — FUROSEMIDE 20 MG PO TABS: 20.0000 mg | ORAL_TABLET | Freq: Every day | ORAL | Status: DC

## 2015-01-23 MED ORDER — POTASSIUM CHLORIDE ER 10 MEQ PO TBCR
10.0000 meq | EXTENDED_RELEASE_TABLET | Freq: Every day | ORAL | Status: DC
  Administered 2015-01-23: 10 meq via ORAL
  Filled 2015-01-23 (×2): qty 1

## 2015-01-23 MED ORDER — POTASSIUM CHLORIDE ER 10 MEQ PO TBCR: 10.0000 meq | EXTENDED_RELEASE_TABLET | Freq: Every day | ORAL | Status: DC

## 2015-01-23 MED ORDER — LABETALOL HCL 100 MG PO TABS: 100.0000 mg | ORAL_TABLET | Freq: Two times a day (BID) | ORAL | Status: DC

## 2015-01-23 NOTE — Discharge Instructions (Signed)
Pulmonary Edema ° °Pulmonary edema (PE) is a condition in which fluid collects in the lungs. This makes it hard to breathe. PE may be a result of the heart not pumping very well or a result of injury.  °CAUSES  °· Coronary artery disease causes blockages in the arteries of the heart. This deprives the heart muscle of oxygen and weakens the muscle. A heart attack is a form of coronary artery disease. °· High blood pressure causes the heart muscle to work harder than usual. Over time, the heart muscle may get stiff, and it starts to work less efficiently. It may also fatigue and weaken. °· Viral infection of the heart (myocarditis) may weaken the heart muscle. °· Metabolic conditions such as thyroid disease, excessive alcohol use, certain vitamin deficiencies, or diabetes may also weaken the heart muscle. °· Leaky or stiff heart valves may impair normal heart function. °· Lung disease may strain the heart muscle. °· Excessive demands on the heart such as too much salt or fluid intake. °· Failure to take prescribed medicines. °· Lung injury from heat or toxins, such as poisonous gas. °· Infection in the lungs or other parts of the body. °· Fluid overload caused by kidney failure or medicines. °SYMPTOMS  °· Shortness of breath at rest or with exertion. °· Grunting, wheezing, or gurgling while breathing. °· Feeling like you cannot get enough air. °· Breaths are shallow and fast. °· A lot of coughing with frothy or bloody mucus. °· Skin may become cool, damp, and turn a pale or bluish color. °DIAGNOSIS  °Initial diagnosis may be based on your history, symptoms, and a physical examination. Additional tests for PE may include: °· Electrocardiography. °· Chest X-ray. °· Blood tests. °· Stress test. °· Ultrasound evaluation of the heart (echocardiography). °· Evaluation by a heart doctor (cardiologist). °· Test of the heart arteries to look for blockages (angiography). °· Check of blood oxygen. °TREATMENT  °Treatment of PE  will depend on the underlying cause and will focus first on relieving the symptoms.  °· Extra oxygen to make breathing easier and assist with removing mucus. This may include breathing treatments or a tube into the lungs and a breathing machine. °· Medicine to help the body get rid of extra water, usually through an IV tube. °· Medicine to help the heart pump better. °· If poor heart function is the cause, treatment may include: °¨ Procedures to open blocked arteries, repair damaged heart valves, or remove some of the damaged heart muscle. °¨ A pacemaker to help the heart pump with less effort. °HOME CARE INSTRUCTIONS  °· Your health care provider will help you determine what type of exercise program may be helpful. It is important to maintain strength and increase it if possible. Pace your activities to avoid shortness of breath or chest pain. Rest for at least 1 hour before and after meals. Cardiac rehabilitation programs are available in some locations. °· Eat a heart-healthy diet low in salt, saturated fat, and cholesterol. Ask for help with choices. °· Make a list of every medicine, vitamin, or herbal supplement you are taking. Keep the list with you at all times. Show it to your health care provider at every visit and before starting a new medicine. Keep the list up to date. °· Ask your health care provider or pharmacist to help you write a plan or schedule so that you know things about each medicine such as: °¨ Why you are taking it. °¨ The possible side effects. °¨   The best time of day to take it. °¨ Foods to take with it or avoid. °¨ When to stop taking it. °· Record your hospital or clinic weight. When you get home, compare it to your scale and record your weight. Then, weigh yourself first thing in the morning daily, and record the weights. You should weigh yourself every morning after you urinate and before you eat breakfast. Wear the same amount of clothing each time you weigh yourself. Provide your  health care provider with your weight record. Daily weights are important in the early recognition of excess fluid. Tell your health care provider right away if you have gained 03 lb/1.4 kg in 1 day, 05 lb/2.3 kg in a week, or as directed by your health care provider. Your medicines may need to be adjusted. °· Blood pressure monitoring should be done as often as directed. You can get a home blood pressure cuff at your drugstore. Record these values and bring them with you for your clinic visits. Notify your health care provider if you become dizzy or light-headed when standing up. °· If you are currently a smoker, it is time to quit. Nicotine makes your heart work harder and is one of the leading causes of cardiac deaths. Do not use nicotine gum or patches before talking to your doctor. °· Make a follow-up appointment with your health care provider as directed. °· Ask your health care provider for a copy of your latest heart tracing (ECG) and keep a copy with you at all times. °SEEK IMMEDIATE MEDICAL CARE IF:  °· You have severe chest pain, especially if the pain is crushing or pressure-like and spreads to the arms, back, neck, or jaw. THIS IS AN EMERGENCY. Do not wait to see if the pain will go away. Call for local emergency medical help. Do not drive yourself to the hospital. °· You have sweating, feel sick to your stomach (nauseous), or are experiencing shortness of breath. °· Your weight increases by 03 lb/1.4 kg in 1 day or 05 lb/2.3 kg in a week. °· You notice increasing shortness of breath that is unusual for you. This may happen during rest, sleep, or with activity. °· You develop chest pain (angina) or pain that is unusual for you. °· You notice more swelling in your hands, feet, ankles, or abdomen. °· You notice lasting (persistent) dizziness, blurred vision, headache, or unsteadiness. °· You begin to cough up bloody mucus (sputum). °· You are unable to sleep because it is hard to breathe. °· You begin to  feel a "jumping" or "fluttering" sensation (palpitations) in the chest that is unusual for you. °MAKE SURE YOU: °· Understand these instructions. °· Will watch your condition. °· Will get help right away if you are not doing well or get worse. °Document Released: 01/30/2003 Document Revised: 11/14/2013 Document Reviewed: 07/17/2013 °Exit[employer]® Patient Information ©2015 [employer]. This information is not intended to replace advice given to you by your health care provider. Make sure you discuss any questions you have with your health care provider. ° °

## 2015-01-23 NOTE — Progress Notes (Signed)
  Patient is eating, ambulating, voiding.  Pain control is good.  Filed Vitals:   01/23/15 0600 01/23/15 0700 01/23/15 0745 01/23/15 0805  BP:    141/73  Pulse: 71 78 69 79  Temp:   98.8 F (37.1 C)   TempSrc:      Resp: 18 17 14 19   Height:      Weight:      SpO2: 95% 96% 97% 96%    lungs:   clear to auscultation cor:    RRR Abdomen:  soft, appropriate tenderness, incisions intact and without erythema or exudate ex:    no cords   Results for orders placed or performed during the hospital encounter of 01/20/15 (from the past 24 hour(s))  Basic metabolic panel     Status: Abnormal   Collection Time: 01/23/15  5:43 AM  Result Value Ref Range   Sodium 142 135 - 145 mmol/L   Potassium 4.2 3.5 - 5.1 mmol/L   Chloride 108 96 - 112 mmol/L   CO2 24 19 - 32 mmol/L   Glucose, Bld 92 70 - 99 mg/dL   BUN 9 6 - 23 mg/dL   Creatinine, Ser 8.290.59 0.50 - 1.10 mg/dL   Calcium 8.2 (L) 8.4 - 10.5 mg/dL   GFR calc non Af Amer >90 >90 mL/min   GFR calc Af Amer >90 >90 mL/min   Anion gap 10 5 - 15    A/P    Post operative day 6, readmitted for postpartum cardiomyopathy and CHF.   Active Problems:  1. Congestive heart failure and post partum cardiomyopathy  Patient followed by Cards- K+ now normal  Echo showed normal EF and preservation of systolic function there was  Moderate hypokinesis of the inferoseptal myocardium.   Per Cards will continue lasix, as patient is diuresing well.   Patient is on fluid restriction. [facility].   2. BP elevated last 6-12 hours  Labetalol 100mg  BID added to regimen  No s/sx of preeclampsia   3. Post op c-section - post operatively no issues normal post op pain, alleviated with prn percocet  4. Anxiety - on  prn ativan and  Zoloft   5. Diarrhea- continued bouts, 3 this am.  C Diff pending and pt on enteric precautions.  Pt felt dizzy this am.  Will hold until c diff comes  back.  Pt on fluid restrictions so will need orthstatic precautions.

## 2015-01-23 NOTE — Progress Notes (Signed)
Late entry. Ref: No primary care provider on file.   Subjective:  Feeling better. Echocardiogram with preserved LV systolic function with moderate inferior septal hypokinesia. EF 50-55 %  Objective:  Vital Signs in the last 24 hours: Temp:  [98.5 F (36.9 C)-98.8 F (37.1 C)] 98.8 F (37.1 C) (03/02 0745) Pulse Rate:  [67-89] 79 (03/02 0805) Cardiac Rhythm:  [-] Normal sinus rhythm (03/02 0745) Resp:  [9-21] 19 (03/02 0805) BP: (133-143)/(64-73) 141/73 mmHg (03/02 0805) SpO2:  [93 %-99 %] 96 % (03/02 0805) Weight:  [107.911 kg (237 lb 14.4 oz)] 107.911 kg (237 lb 14.4 oz) (03/02 0455)  Physical Exam: BP Readings from Last 1 Encounters:  01/23/15 141/73    Wt Readings from Last 1 Encounters:  01/23/15 107.911 kg (237 lb 14.4 oz)    Weight change: -3.583 kg (-7 lb 14.4 oz)  HEENT: [place]/AT, Eyes- PERL, EOMI, Conjunctiva-Pink, Sclera-Non-icteric Neck: No JVD, No bruit, Trachea midline. Lungs:  Clearing, Bilateral. Cardiac:  Regular rhythm, normal S1 and S2, no S3.  Abdomen:  Soft, non-tender. Extremities:  1 + edema present. No cyanosis. No clubbing. CNS: AxOx3, Cranial nerves grossly intact, moves all 4 extremities. Right handed. Skin: Warm and dry.   Intake/Output from previous day: 03/01 0701 - 03/02 0700 In: 600 [P.O.:600] Out: 2300 [Urine:2300]    Lab Results: BMET    Component Value Date/Time   NA 142 01/23/2015 0543   NA 137 01/22/2015 0740   NA 138 01/21/2015 0520   K 4.2 01/23/2015 0543   K 3.4* 01/22/2015 0740   K 3.3* 01/21/2015 0520   CL 108 01/23/2015 0543   CL 107 01/22/2015 0740   CL 110 01/21/2015 0520   CO2 24 01/23/2015 0543   CO2 23 01/22/2015 0740   CO2 26 01/21/2015 0520   GLUCOSE 92 01/23/2015 0543   GLUCOSE 84 01/22/2015 0740   GLUCOSE 84 01/21/2015 0520   BUN 9 01/23/2015 0543   BUN 8 01/22/2015 0740   BUN 7 01/21/2015 0520   CREATININE 0.59 01/23/2015 0543   CREATININE 0.58 01/22/2015 0740   CREATININE 0.52 01/21/2015 0520   CALCIUM 8.2* 01/23/2015 0543   CALCIUM 8.1* 01/22/2015 0740   CALCIUM 7.7* 01/21/2015 0520   GFRNONAA >90 01/23/2015 0543   GFRNONAA >90 01/22/2015 0740   GFRNONAA >90 01/21/2015 0520   GFRAA >90 01/23/2015 0543   GFRAA >90 01/22/2015 0740   GFRAA >90 01/21/2015 0520   CBC    Component Value Date/Time   WBC 9.5 01/22/2015 0740   RBC 3.14* 01/22/2015 0740   HGB 8.7* 01/22/2015 0740   HCT 27.8* 01/22/2015 0740   PLT 283 01/22/2015 0740   MCV 88.5 01/22/2015 0740   MCH 27.7 01/22/2015 0740   MCHC 31.3 01/22/2015 0740   RDW 18.2* 01/22/2015 0740   LYMPHSABS 1.0 01/17/2015 1000   MONOABS 1.0 01/17/2015 1000   EOSABS 0.0 01/17/2015 1000   BASOSABS 0.0 01/17/2015 1000   HEPATIC Function Panel  Recent Labs  01/16/15 0920  PROT 6.0   HEMOGLOBIN A1C No components found for: HGA1C,  MPG CARDIAC ENZYMES Lab Results  Component Value Date   CKTOTAL 40 11/29/2014   CKMB 1.5 11/29/2014   TROPONINI <0.03 01/22/2015   TROPONINI <0.03 01/22/2015   TROPONINI 0.05* 01/21/2015   BNP No results for input(s): PROBNP in the last 8760 hours. TSH No results for input(s): TSH in the last 8760 hours. CHOLESTEROL No results for input(s): CHOL in the last 8760 hours.  Scheduled Meds: .  famotidine  20 mg Oral BID  . furosemide  20 mg Oral Daily  . ibuprofen  600 mg Oral Q6H  . labetalol  100 mg Oral BID  . magnesium oxide  400 mg Oral Daily  . potassium chloride  10 mEq Oral Daily  . sertraline  50 mg Oral Daily   Continuous Infusions:  PRN Meds:.LORazepam, oxyCODONE-acetaminophen, promethazine  Assessment/Plan: Acute left heart diastolic failure Post partum cardiomyopathy less likely. Hypertension Obesity Hypokalemia  Decrease lasix dose. Increase activity.       LOS: 3 days    Orpah Cobb  MD  01/23/2015, 9:38 AM

## 2015-01-23 NOTE — Progress Notes (Signed)
Ref: No primary care provider on file.   Subjective:  Some diarrhea. Afebrile. Questions about activity.  Objective:  Vital Signs in the last 24 hours: Temp:  [98.5 F (36.9 C)-98.8 F (37.1 C)] 98.8 F (37.1 C) (03/02 0745) Pulse Rate:  [67-89] 79 (03/02 0805) Cardiac Rhythm:  [-] Normal sinus rhythm (03/02 0745) Resp:  [9-21] 19 (03/02 0805) BP: (133-143)/(64-73) 141/73 mmHg (03/02 0805) SpO2:  [93 %-99 %] 96 % (03/02 0805) Weight:  [107.911 kg (237 lb 14.4 oz)] 107.911 kg (237 lb 14.4 oz) (03/02 0455)  Physical Exam: BP Readings from Last 1 Encounters:  01/23/15 141/73    Wt Readings from Last 1 Encounters:  01/23/15 107.911 kg (237 lb 14.4 oz)    Weight change: -3.583 kg (-7 lb 14.4 oz)  HEENT: [place]/AT, Eyes- PERL, EOMI, Conjunctiva-Pink, Sclera-Non-icteric Neck: No JVD, No bruit, Trachea midline. Lungs:  Clear, Bilateral. Cardiac:  Regular rhythm, normal S1 and S2, no S3.  Abdomen:  Soft, non-tender. Extremities:  Trace edema present. No cyanosis. No clubbing. CNS: AxOx3, Cranial nerves grossly intact, moves all 4 extremities. Right handed. Skin: Warm and dry.   Intake/Output from previous day: 03/01 0701 - 03/02 0700 In: 600 [P.O.:600] Out: 2300 [Urine:2300]    Lab Results: BMET    Component Value Date/Time   NA 142 01/23/2015 0543   NA 137 01/22/2015 0740   NA 138 01/21/2015 0520   K 4.2 01/23/2015 0543   K 3.4* 01/22/2015 0740   K 3.3* 01/21/2015 0520   CL 108 01/23/2015 0543   CL 107 01/22/2015 0740   CL 110 01/21/2015 0520   CO2 24 01/23/2015 0543   CO2 23 01/22/2015 0740   CO2 26 01/21/2015 0520   GLUCOSE 92 01/23/2015 0543   GLUCOSE 84 01/22/2015 0740   GLUCOSE 84 01/21/2015 0520   BUN 9 01/23/2015 0543   BUN 8 01/22/2015 0740   BUN 7 01/21/2015 0520   CREATININE 0.59 01/23/2015 0543   CREATININE 0.58 01/22/2015 0740   CREATININE 0.52 01/21/2015 0520   CALCIUM 8.2* 01/23/2015 0543   CALCIUM 8.1* 01/22/2015 0740   CALCIUM 7.7* 01/21/2015  0520   GFRNONAA >90 01/23/2015 0543   GFRNONAA >90 01/22/2015 0740   GFRNONAA >90 01/21/2015 0520   GFRAA >90 01/23/2015 0543   GFRAA >90 01/22/2015 0740   GFRAA >90 01/21/2015 0520   CBC    Component Value Date/Time   WBC 9.5 01/22/2015 0740   RBC 3.14* 01/22/2015 0740   HGB 8.7* 01/22/2015 0740   HCT 27.8* 01/22/2015 0740   PLT 283 01/22/2015 0740   MCV 88.5 01/22/2015 0740   MCH 27.7 01/22/2015 0740   MCHC 31.3 01/22/2015 0740   RDW 18.2* 01/22/2015 0740   LYMPHSABS 1.0 01/17/2015 1000   MONOABS 1.0 01/17/2015 1000   EOSABS 0.0 01/17/2015 1000   BASOSABS 0.0 01/17/2015 1000   HEPATIC Function Panel  Recent Labs  01/16/15 0920  PROT 6.0   HEMOGLOBIN A1C No components found for: HGA1C,  MPG CARDIAC ENZYMES Lab Results  Component Value Date   CKTOTAL 40 11/29/2014   CKMB 1.5 11/29/2014   TROPONINI <0.03 01/22/2015   TROPONINI <0.03 01/22/2015   TROPONINI 0.05* 01/21/2015   BNP No results for input(s): PROBNP in the last 8760 hours. TSH No results for input(s): TSH in the last 8760 hours. CHOLESTEROL No results for input(s): CHOL in the last 8760 hours.  Scheduled Meds: . famotidine  20 mg Oral BID  . furosemide  20 mg  Oral Daily  . ibuprofen  600 mg Oral Q6H  . labetalol  100 mg Oral BID  . magnesium oxide  400 mg Oral Daily  . potassium chloride  10 mEq Oral Daily  . sertraline  50 mg Oral Daily   Continuous Infusions:  PRN Meds:.LORazepam, oxyCODONE-acetaminophen, promethazine  Assessment/Plan: Acute left heart diastolic failure Post partum cardiomyopathy less likely. Hypertension-controlled Obesity Hypokalemia-resolved  Decrease lasix and potassium dose. May increase fluids to 1500 cc/day. Ambulate as tolerated. May lift 10 to 15 pounds as needed. May discharge home.  F/U Dr. Carolanne Grumbling in 1 month.     LOS: 3 days    Orpah Cobb  MD  01/23/2015, 9:46 AM

## 2015-01-23 NOTE — Discharge Summary (Signed)
Physician Discharge Summary  Patient ID: [name] MRN: [id number] DOB/AGE: [birth year] 34 y.o.  Admit date: 01/20/2015 Discharge date: 01/23/2015  Admission Diagnoses:SOB, CHF post partum  Discharge Diagnoses: post partum cardiomyopathy Active Problems:   Congestive heart failure   Discharged Condition: good  Hospital Course: Pt admitted for SOB and weakness- found to have CHF on CXR.  Seen by Cards- consistent with postpartum cardiomyopathy.  Pt responded to lasix and rest.  Echo showed normal EF. BP increased and pt started on labetalol.  Also had diarrheal illness but C Diff neg.  K+ repleted.  Consults: cardiology  Significant Diagnostic Studies: labs:   Results for orders placed or performed during the hospital encounter of 01/20/15 (from the past 48 hour(s))  Troponin I     Status: Abnormal   Collection Time: 01/21/15  8:00 PM  Result Value Ref Range   Troponin I 0.05 (H) <0.031 ng/mL    Comment:        PERSISTENTLY INCREASED TROPONIN VALUES IN THE RANGE OF 0.04-0.49 ng/mL CAN BE SEEN IN:       -UNSTABLE ANGINA       -CONGESTIVE HEART FAILURE       -MYOCARDITIS       -CHEST TRAUMA       -ARRYHTHMIAS       -LATE PRESENTING MYOCARDIAL INFARCTION       -COPD   CLINICAL FOLLOW-UP RECOMMENDED. Performed at [facility]   Troponin I     Status: None   Collection Time: 01/22/15  1:33 AM  Result Value Ref Range   Troponin I <0.03 <0.031 ng/mL    Comment:        NO INDICATION OF MYOCARDIAL INJURY. Performed at [facility]   Troponin I     Status: None   Collection Time: 01/22/15  7:40 AM  Result Value Ref Range   Troponin I <0.03 <0.031 ng/mL    Comment:        NO INDICATION OF MYOCARDIAL INJURY. Performed at [facility]   CBC     Status: Abnormal   Collection Time: 01/22/15  7:40 AM  Result Value Ref Range   WBC 9.5 4.0 - 10.5 K/uL   RBC 3.14 (L) 3.87 - 5.11 MIL/uL   Hemoglobin 8.7 (L) 12.0 - 15.0 g/dL   HCT 27.8 (L) 36.0 - 46.0 %   MCV 88.5 78.0 - 100.0 fL   MCH 27.7 26.0 - 34.0 pg   MCHC 31.3 30.0 - 36.0 g/dL   RDW 18.2 (H) 11.5 - 15.5 %   Platelets 283 150 - 400 K/uL  Basic metabolic panel     Status: Abnormal   Collection Time: 01/22/15  7:40 AM  Result Value Ref Range   Sodium 137 135 - 145 mmol/L   Potassium 3.4 (L) 3.5 - 5.1 mmol/L    Comment: Performed at [facility]   Chloride 107 96 - 112 mmol/L   CO2 23 19 - 32 mmol/L   Glucose, Bld 84 70 - 99 mg/dL   BUN 8 6 - 23 mg/dL   Creatinine, Ser 0.58 0.50 - 1.10 mg/dL   Calcium 8.1 (L) 8.4 - 10.5 mg/dL   GFR calc non Af Amer >90 >90 mL/min   GFR calc Af Amer >90 >90 mL/min    Comment: (NOTE) The eGFR has been calculated using the CKD EPI equation. This calculation has not been validated in all clinical situations. eGFR's persistently <90 mL/min signify possible Chronic Kidney Disease.  Anion gap 7 5 - 15  Basic metabolic panel     Status: Abnormal   Collection Time: 01/23/15  5:43 AM  Result Value Ref Range   Sodium 142 135 - 145 mmol/L    Comment: REPEATED TO VERIFY   Potassium 4.2 3.5 - 5.1 mmol/L    Comment: REPEATED TO VERIFY DELTA CHECK NOTED SLIGHT HEMOLYSIS    Chloride 108 96 - 112 mmol/L   CO2 24 19 - 32 mmol/L   Glucose, Bld 92 70 - 99 mg/dL   BUN 9 6 - 23 mg/dL   Creatinine, Ser 0.59 0.50 - 1.10 mg/dL   Calcium 8.2 (L) 8.4 - 10.5 mg/dL   GFR calc non Af Amer >90 >90 mL/min   GFR calc Af Amer >90 >90 mL/min    Comment: (NOTE) The eGFR has been calculated using the CKD EPI equation. This calculation has not been validated in all clinical situations. eGFR's persistently <90 mL/min signify possible Chronic Kidney Disease.    Anion gap 10 5 - 15  Clostridium Difficile by PCR     Status: None   Collection Time: 01/23/15  8:45 AM  Result Value Ref Range   C difficile by pcr NEGATIVE NEGATIVE    Comment: Performed at [facility]    Treatments: cardiac meds: labetolol and furosemide  Discharge Exam: Blood  pressure 141/73, pulse 79, temperature 98.8 F (37.1 C), temperature source Oral, resp. rate 19, height 5' 2"  (1.575 m), weight 107.911 kg (237 lb 14.4 oz), SpO2 96 %, unknown if currently breastfeeding.   Disposition: 01-Home or Self Care  Discharge Instructions    Call MD for:  temperature >100.4    Complete by:  As directed      Diet - low sodium heart healthy    Complete by:  As directed      Discharge instructions    Complete by:  As directed   Routine activity.  Take over the counter ibuprophen up to 800 mg every 8 hours for pain.     Discharge wound care:    Complete by:  As directed   Sitz baths and icepacks to perineum.  If stitches, they will dissolve.     Sexual acrtivity    Complete by:  As directed   Nothing per vagina for 6 weeks.            Medication List    TAKE these medications        ACCU-CHEK FASTCLIX LANCETS Misc     ACCU-CHEK SMARTVIEW test strip  Generic drug:  glucose blood     acetaminophen 500 MG tablet  Commonly known as:  TYLENOL  Take 1,000 mg by mouth every 6 (six) hours as needed for moderate pain.     calcium carbonate 500 MG chewable tablet  Commonly known as:  TUMS - dosed in mg elemental calcium  Chew 2 tablets by mouth 3 (three) times daily as needed for indigestion or heartburn.     CITRANATAL HARMONY 27-1-260 MG Caps  Take 1 capsule by mouth daily.     ferrous fumarate 325 (106 FE) MG Tabs tablet  Commonly known as:  HEMOCYTE - 106 mg FE  Take 1 tablet by mouth daily.     Fish Oil 1200 MG Caps  Take 1 capsule by mouth daily.     furosemide 20 MG tablet  Commonly known as:  LASIX  Take 1 tablet (20 mg total) by mouth daily.     ibuprofen 200  MG tablet  Commonly known as:  ADVIL,MOTRIN  Take 400 mg by mouth every 6 (six) hours as needed for headache or mild pain.     labetalol 100 MG tablet  Commonly known as:  NORMODYNE  Take 1 tablet (100 mg total) by mouth 2 (two) times daily.     loratadine 10 MG tablet  Commonly  known as:  CLARITIN  Take 10 mg by mouth daily as needed for allergies.     LORazepam 0.5 MG tablet  Commonly known as:  ATIVAN  Take 1 tablet (0.5 mg total) by mouth every 4 (four) hours as needed for anxiety.     omeprazole 40 MG capsule  Commonly known as:  PRILOSEC  Take 40 mg by mouth daily.     ondansetron 4 MG tablet  Commonly known as:  ZOFRAN  Take 1 tablet (4 mg total) by mouth every 8 (eight) hours as needed for nausea or vomiting.     oxyCODONE-acetaminophen 5-325 MG per tablet  Commonly known as:  PERCOCET/ROXICET  Take 1 tablet by mouth every 4 (four) hours as needed (for pain scale less than 7).     potassium chloride 10 MEQ tablet  Commonly known as:  K-DUR  Take 1 tablet (10 mEq total) by mouth daily.     pyridOXINE 100 MG tablet  Commonly known as:  VITAMIN B-6  Take 100 mg by mouth daily.     ranitidine 150 MG tablet  Commonly known as:  ZANTAC  Take 150 mg by mouth daily.     sertraline 50 MG tablet  Commonly known as:  ZOLOFT  Take 50 mg by mouth daily.     SYSTANE OP  Apply 1 drop to eye 2 (two) times daily.           Follow-up Information    Follow up with Sueanne Margarita, MD. Schedule an appointment as soon as possible for a visit in 2 weeks.   Specialty:  Cardiology   Contact information:   [address] [address] [phone]       Follow up with Jerelyn Charles, MD.   Specialty:  Obstetrics   Contact information:   [place] [address] [phone]       Signed: Daria Pastures 01/23/2015, 3:37 PM

## 2015-01-23 NOTE — Progress Notes (Addendum)
Pt feeling better.  Not as dizzy.  Diarrhea now only loose.  C Diff was negative.  Ok to d/c.

## 2015-01-23 NOTE — Progress Notes (Signed)
Ref: No primary care provider on file.   Subjective:  Feeling better. Good diuresis. Afebrile.   Objective:  Vital Signs in the last 24 hours: Temp:  [98.5 F (36.9 C)-98.8 F (37.1 C)] 98.8 F (37.1 C) (03/02 0745) Pulse Rate:  [67-89] 79 (03/02 0805) Cardiac Rhythm:  [-] Normal sinus rhythm (03/02 0745) Resp:  [9-21] 19 (03/02 0805) BP: (133-143)/(64-73) 141/73 mmHg (03/02 0805) SpO2:  [93 %-99 %] 96 % (03/02 0805) Weight:  [107.911 kg (237 lb 14.4 oz)] 107.911 kg (237 lb 14.4 oz) (03/02 0455)  Physical Exam: BP Readings from Last 1 Encounters:  01/23/15 141/73    Wt Readings from Last 1 Encounters:  01/23/15 107.911 kg (237 lb 14.4 oz)    Weight change: -3.583 kg (-7 lb 14.4 oz)  HEENT: [place]/AT, Eyes- PERL, EOMI, Conjunctiva-Pink, Sclera-Non-icteric Neck: No JVD, No bruit, Trachea midline. Lungs:  Clear, Bilateral. Cardiac:  Regular rhythm, normal S1 and S2, no S3.  Abdomen:  Soft, non-tender. Extremities:  Trace edema present. No cyanosis. No clubbing. CNS: AxOx3, Cranial nerves grossly intact, moves all 4 extremities. Right handed. Skin: Warm and dry.   Intake/Output from previous day: 03/01 0701 - 03/02 0700 In: 600 [P.O.:600] Out: 2300 [Urine:2300]    Lab Results: BMET    Component Value Date/Time   NA 142 01/23/2015 0543   NA 137 01/22/2015 0740   NA 138 01/21/2015 0520   K 4.2 01/23/2015 0543   K 3.4* 01/22/2015 0740   K 3.3* 01/21/2015 0520   CL 108 01/23/2015 0543   CL 107 01/22/2015 0740   CL 110 01/21/2015 0520   CO2 24 01/23/2015 0543   CO2 23 01/22/2015 0740   CO2 26 01/21/2015 0520   GLUCOSE 92 01/23/2015 0543   GLUCOSE 84 01/22/2015 0740   GLUCOSE 84 01/21/2015 0520   BUN 9 01/23/2015 0543   BUN 8 01/22/2015 0740   BUN 7 01/21/2015 0520   CREATININE 0.59 01/23/2015 0543   CREATININE 0.58 01/22/2015 0740   CREATININE 0.52 01/21/2015 0520   CALCIUM 8.2* 01/23/2015 0543   CALCIUM 8.1* 01/22/2015 0740   CALCIUM 7.7* 01/21/2015 0520   GFRNONAA >90 01/23/2015 0543   GFRNONAA >90 01/22/2015 0740   GFRNONAA >90 01/21/2015 0520   GFRAA >90 01/23/2015 0543   GFRAA >90 01/22/2015 0740   GFRAA >90 01/21/2015 0520   CBC    Component Value Date/Time   WBC 9.5 01/22/2015 0740   RBC 3.14* 01/22/2015 0740   HGB 8.7* 01/22/2015 0740   HCT 27.8* 01/22/2015 0740   PLT 283 01/22/2015 0740   MCV 88.5 01/22/2015 0740   MCH 27.7 01/22/2015 0740   MCHC 31.3 01/22/2015 0740   RDW 18.2* 01/22/2015 0740   LYMPHSABS 1.0 01/17/2015 1000   MONOABS 1.0 01/17/2015 1000   EOSABS 0.0 01/17/2015 1000   BASOSABS 0.0 01/17/2015 1000   HEPATIC Function Panel  Recent Labs  01/16/15 0920  PROT 6.0   HEMOGLOBIN A1C No components found for: HGA1C,  MPG CARDIAC ENZYMES Lab Results  Component Value Date   CKTOTAL 40 11/29/2014   CKMB 1.5 11/29/2014   TROPONINI <0.03 01/22/2015   TROPONINI <0.03 01/22/2015   TROPONINI 0.05* 01/21/2015   BNP No results for input(s): PROBNP in the last 8760 hours. TSH No results for input(s): TSH in the last 8760 hours. CHOLESTEROL No results for input(s): CHOL in the last 8760 hours.  Scheduled Meds: . famotidine  20 mg Oral BID  . furosemide  20 mg Oral  Daily  . ibuprofen  600 mg Oral Q6H  . labetalol  100 mg Oral BID  . magnesium oxide  400 mg Oral Daily  . potassium chloride  10 mEq Oral Daily  . sertraline  50 mg Oral Daily   Continuous Infusions:  PRN Meds:.LORazepam, oxyCODONE-acetaminophen, promethazine  Assessment/Plan: Acute left heart diastolic failure Post partum cardiomyopathy less likely. Hypertension Obesity Hypokalemia  Increase activity. Home soon if stable.     LOS: 3 days    Orpah Cobb  MD  01/23/2015, 9:44 AM

## 2015-01-24 ENCOUNTER — Encounter: Payer: Managed Care, Other (non HMO) | Admitting: Physical Therapy

## 2015-01-24 ENCOUNTER — Telehealth: Payer: Self-pay | Admitting: Cardiology

## 2015-01-24 NOTE — Telephone Encounter (Signed)
Called patient back about her appointment. Advised her that if she has any problems between now and three weeks to give the office a call. Transferred patient to scheduling to be put on waiting list for any earlier appointments. Patient verbalized understanding and agreed to plan.

## 2015-01-24 NOTE — Telephone Encounter (Signed)
New Message  Per D/X notes- 2 wk f/u w/ Dr. Mayford Knifeurner. Pt was sched 3/23 w/ turner but wanted to check w/ Rn to see if she needs to come in sooner. Please call back and discuss.

## 2015-02-04 NOTE — Progress Notes (Signed)
Cardiology Office Note   Date:  02/05/2015   ID:  [name], DOB 09-10-1981, MRN [id number]  PCP:  Jessica Rowan, MD  Cardiologist:   Quintella Reichert, MD   Chief Complaint  Patient presents with  . Chest Pain  . Shortness of Breath      History of Present Illness: [name] is a 34 y.o. female with type II DM and GERD who delivered a healthy baby boy 2 weeks aog who presents today for followup of chest pain and SOB. This has been off and on for the past few months as her abdomen has gotten bigger. Her 2D echo was normal. She now presents back today for followup.3days after he delivery she developed increased LE edema, dizziness and SOB with chest xray showing perihilar edema and responded well to diuresis.   Repeat echo showed preserved LVF with normal LV size and mild MR.  Her CP has significantly improved on Zantac. She still has some mild chest burning.  She is still SOB but 2D echo showed normal LVF and the SOB has improved.  She has been anemic after delivery. She is on OTC iron supplements.  Her LE edema has resolved.    Past Medical History  Diagnosis Date  . Allergy   . Arthritis   . Anxiety   . Asthma     exercise induced as child  . Depression   . GERD (gastroesophageal reflux disease)   . IBS (irritable bowel syndrome)   . Diabetes mellitus without complication   . Gestational diabetes 2016    glyburide  . Diastolic CHF     in setting of csection and severe anemia - resoved with normal LVF and normal LV size    Past Surgical History  Procedure Laterality Date  . Cholecystectomy    . Ganglion cyst excision    . Appendectomy    . Cesarean section N/A 01/17/2015    Procedure: CESAREAN SECTION;  Surgeon: Marlow Baars, MD;  Location: [facility];  Service: Obstetrics;  Laterality: N/A;     Current Outpatient Prescriptions  Medication Sig Dispense Refill  . ACCU-CHEK FASTCLIX LANCETS MISC     . ACCU-CHEK SMARTVIEW test strip   2  . acetaminophen (TYLENOL)  500 MG tablet Take 1,000 mg by mouth every 6 (six) hours as needed for moderate pain.    . calcium carbonate (TUMS - DOSED IN MG ELEMENTAL CALCIUM) 500 MG chewable tablet Chew 2 tablets by mouth 3 (three) times daily as needed for indigestion or heartburn.    . ferrous fumarate (HEMOCYTE - 106 MG FE) 325 (106 FE) MG TABS tablet Take 1 tablet by mouth daily.    Marland Kitchen ibuprofen (ADVIL,MOTRIN) 200 MG tablet Take 400 mg by mouth every 6 (six) hours as needed for headache or mild pain.    Marland Kitchen loratadine (CLARITIN) 10 MG tablet Take 10 mg by mouth daily as needed for allergies.     Marland Kitchen LORazepam (ATIVAN) 0.5 MG tablet Take 1 tablet (0.5 mg total) by mouth every 4 (four) hours as needed for anxiety. 10 tablet 0  . Omega-3 Fatty Acids (FISH OIL) 1200 MG CAPS Take 1 capsule by mouth daily.    Marland Kitchen omeprazole (PRILOSEC) 40 MG capsule Take 40 mg by mouth daily.  1  . Polyethyl Glycol-Propyl Glycol (SYSTANE OP) Apply 1 drop to eye 2 (two) times daily.    . potassium chloride (K-DUR) 10 MEQ tablet Take 1 tablet (10 mEq total) by mouth daily. 30 tablet 1  .  Prenat-FeFmCb-DSS-FA-DHA w/o A (CITRANATAL HARMONY) 27-1-260 MG CAPS Take 1 capsule by mouth daily.  11  . pyridOXINE (VITAMIN B-6) 100 MG tablet Take 100 mg by mouth daily.    . ranitidine (ZANTAC) 150 MG tablet Take 150 mg by mouth daily.  0  . sertraline (ZOLOFT) 50 MG tablet Take 50 mg by mouth daily.  1   No current facility-administered medications for this visit.    Allergies:   Codeine; Levaquin; Seroquel; Vicodin; and Wellbutrin    Social History:  The patient  reports that she has never smoked. She has never used smokeless tobacco. She reports that she does not drink alcohol or use illicit drugs.   Family History:  The patient's family history includes Arthritis in her mother; Depression in her father; Diabetes in her father and mother; Heart disease in her father; Hypertension in her mother.    ROS:  Please see the history of present illness.    Otherwise, review of systems are positive for none.   All other systems are reviewed and negative.    PHYSICAL EXAM: VS:  BP 110/70 mmHg  Pulse 79  Ht 5\' 2"  (1.575 m)  Wt 222 lb 9.6 oz (100.971 kg)  BMI 40.70 kg/m2  SpO2 97% , BMI Body mass index is 40.7 kg/(m^2). GEN: Well nourished, well developed, in no acute distress HEENT: normal Neck: no JVD, carotid bruits, or masses Cardiac: RRR; no murmurs, rubs, or gallops,no edema  Respiratory:  clear to auscultation bilaterally, normal work of breathing GI: soft, nontender, nondistended, + BS MS: no deformity or atrophy Skin: warm and dry, no rash Neuro:  Strength and sensation are intact Psych: euthymic mood, full affect   EKG:  EKG is not ordered today.    Recent Labs: 01/16/2015: ALT 16 01/21/2015: B Natriuretic Peptide 224.0* 01/22/2015: Hemoglobin 8.7*; Platelets 283 01/23/2015: BUN 9; Creatinine 0.59; Potassium 4.2; Sodium 142    Lipid Panel No results found for: CHOL, TRIG, HDL, CHOLHDL, VLDL, LDLCALC, LDLDIRECT    Wt Readings from Last 3 Encounters:  02/05/15 222 lb 9.6 oz (100.971 kg)  01/23/15 237 lb 14.4 oz (107.911 kg)  01/16/15 259 lb (117.482 kg)       ASSESSMENT AND PLAN:  1. Atypical chest pain that seems to have occurred when her abdomen gotbigger. CP is most likely due to GI etiology and has improved after starting Zantac. 2. Residual SOB but improved since delivery.  She is anemic with Hbg 8.7 which is probably contributing and is on iron suppl 3. GERD 4. Type II DM 5. Acute diastolic CHF immediately after C section most likely related to fluids given during C section as well as anemia.  2D echo with preserved LVF and normal LV size.  This has resolved.  Stop fluid and sodium restriction and lasix.  Her BP is very well controlled so I think we can get her off of the Labetolol.  I have instructed her to go down to 1 tablet daily for 3 days then 1 tablet every other day for 4 days then stop.  Once she has  finished the labetolol I have asked her to check her BP daily for a week and call with the results.  Current medicines are reviewed at length with the patient today.  The patient does not have concerns regarding medicines.  The following changes have been made:  no change  Labs/ tests ordered today include: None  No orders of the defined types were placed in this encounter.  Disposition:   FU with me in 6 weeks   Signed, Quintella Reichert, MD  02/05/2015 10:45 AM    [facility] [address] [place], [place], [place]  [address] Phone: [phone]; Fax: [phone]

## 2015-02-05 ENCOUNTER — Ambulatory Visit (INDEPENDENT_AMBULATORY_CARE_PROVIDER_SITE_OTHER): Payer: Managed Care, Other (non HMO) | Admitting: Cardiology

## 2015-02-05 ENCOUNTER — Encounter: Payer: Self-pay | Admitting: Cardiology

## 2015-02-05 VITALS — BP 110/70 | HR 79 | Ht 62.0 in | Wt 222.6 lb

## 2015-02-05 DIAGNOSIS — R0609 Other forms of dyspnea: Secondary | ICD-10-CM

## 2015-02-05 DIAGNOSIS — I5031 Acute diastolic (congestive) heart failure: Secondary | ICD-10-CM

## 2015-02-05 DIAGNOSIS — K219 Gastro-esophageal reflux disease without esophagitis: Secondary | ICD-10-CM

## 2015-02-05 DIAGNOSIS — R079 Chest pain, unspecified: Secondary | ICD-10-CM

## 2015-02-05 DIAGNOSIS — I5030 Unspecified diastolic (congestive) heart failure: Secondary | ICD-10-CM | POA: Insufficient documentation

## 2015-02-05 NOTE — Patient Instructions (Signed)
Your physician has recommended you make the following change in your medication:  1) DECREASE Labetalol to 1 tablet daily for 3 days then 1 tablet EVERY OTHER DAY for 4 days then STOP COMPLETELY 2) STOP LASIX  Check your blood pressure daily for one week once off the labetalol and call with results.   Your physician recommends that you schedule a follow-up appointment in: 6 weeks with Dr. Mayford Knifeurner.

## 2015-02-13 ENCOUNTER — Ambulatory Visit: Payer: Managed Care, Other (non HMO) | Admitting: Cardiology

## 2015-02-20 ENCOUNTER — Telehealth: Payer: Self-pay | Admitting: Cardiology

## 2015-02-20 NOTE — Telephone Encounter (Signed)
Left message on private VM that BP are fine and to call us if she needs anything.

## 2015-02-20 NOTE — Telephone Encounter (Signed)
New message      Pt c/o BP issue: STAT if pt c/o blurred vision, one-sided weakness or slurred speech  1. What are your last 5 BP readings? March 23rd 127/86, 24th 135/94, 25th 143/98, 26th 134/87, 27th 129/82, 28th 108/85, 29th 129/84, 30th 130/80 2. Are you having any other symptoms (ex. Dizziness, headache, blurred vision, passed out)? No 3. What is your BP issue? Calling to give bp readings

## 2015-02-20 NOTE — Telephone Encounter (Signed)
To Dr. Turner for review. 

## 2015-02-20 NOTE — Telephone Encounter (Signed)
BPs for the most part are fine

## 2015-02-27 ENCOUNTER — Ambulatory Visit: Payer: Managed Care, Other (non HMO) | Admitting: Cardiology

## 2015-04-09 ENCOUNTER — Ambulatory Visit (INDEPENDENT_AMBULATORY_CARE_PROVIDER_SITE_OTHER): Payer: Managed Care, Other (non HMO) | Admitting: Cardiology

## 2015-04-09 ENCOUNTER — Encounter: Payer: Self-pay | Admitting: Cardiology

## 2015-04-09 VITALS — BP 110/72 | HR 82 | Ht 62.0 in | Wt 229.8 lb

## 2015-04-09 DIAGNOSIS — K219 Gastro-esophageal reflux disease without esophagitis: Secondary | ICD-10-CM

## 2015-04-09 DIAGNOSIS — R079 Chest pain, unspecified: Secondary | ICD-10-CM | POA: Diagnosis not present

## 2015-04-09 DIAGNOSIS — R0602 Shortness of breath: Secondary | ICD-10-CM

## 2015-04-09 DIAGNOSIS — I5030 Unspecified diastolic (congestive) heart failure: Secondary | ICD-10-CM | POA: Diagnosis not present

## 2015-04-09 NOTE — Patient Instructions (Signed)
Medication Instructions:  Your physician recommends that you continue on your current medications as directed. Please refer to the Current Medication list given to you today.   Labwork: None  Testing/Procedures: None  Follow-Up: Your physician recommends that you schedule a follow-up appointment AS NEEDED with Dr. Turner.  Any Other Special Instructions Will Be Listed Below (If Applicable).   

## 2015-04-09 NOTE — Progress Notes (Signed)
Cardiology Office Note   Date:  04/09/2015   ID:  Jessica AdjutantRobyn Obrien, DOB 11/25/1980, MRN [id number]  PCP:  Maryelizabeth RowanEWEY,ELIZABETH, MD    Chief Complaint  Patient presents with  . Follow-up    DOE, chest pain      History of Present Illness:  [name] is a 34 y.o. female with type II DM and GERD who presents today for followup of chest pain and SOB.Her 2D echo was normal.When I saw her last she had just had a baby and complained that 3 days after her delivery she developed increased LE edema, dizziness and SOB with chest xray showing perihilar edema and responded well to diuresis. Repeat echo showed preserved LVF with normal LV size and mild MR. Her CP has resolved on Zantac. She now presents back for followup.  She is doing well.  She denies any chest pain, SOB, DOE, LE edema, dizziness, palpitations or syncope.   Past Medical History  Diagnosis Date  . Allergy   . Arthritis   . Anxiety   . Asthma     exercise induced as child  . Depression   . GERD (gastroesophageal reflux disease)   . IBS (irritable bowel syndrome)   . Diabetes mellitus without complication   . Gestational diabetes 2016    glyburide  . Diastolic CHF     in setting of csection and severe anemia - resoved with normal LVF and normal LV size    Past Surgical History  Procedure Laterality Date  . Cholecystectomy    . Ganglion cyst excision    . Appendectomy    . Cesarean section N/A 01/17/2015    Procedure: CESAREAN SECTION;  Surgeon: Marlow Baarsyanna Clark, MD;  Location: [facility];  Service: Obstetrics;  Laterality: N/A;     Current Outpatient Prescriptions  Medication Sig Dispense Refill  . acetaminophen (TYLENOL) 500 MG tablet Take 1,000 mg by mouth every 6 (six) hours as needed for moderate pain.    . calcium carbonate (TUMS - DOSED IN MG ELEMENTAL CALCIUM) 500 MG chewable tablet Chew 2 tablets by mouth 3 (three) times daily as needed for indigestion or heartburn.    . ferrous fumarate (HEMOCYTE - 106 MG FE) 325  (106 FE) MG TABS tablet Take 1 tablet by mouth daily.    Marland Kitchen. ibuprofen (ADVIL,MOTRIN) 200 MG tablet Take 400 mg by mouth every 6 (six) hours as needed for headache or mild pain.    Marland Kitchen. levonorgestrel (MIRENA) 20 MCG/24HR IUD 1 each by Intrauterine route once.    . loratadine (CLARITIN) 10 MG tablet Take 10 mg by mouth daily as needed for allergies.     Marland Kitchen. LORazepam (ATIVAN) 0.5 MG tablet Take 1 tablet (0.5 mg total) by mouth every 4 (four) hours as needed for anxiety. 10 tablet 0  . Omega-3 Fatty Acids (FISH OIL) 1200 MG CAPS Take 1 capsule by mouth daily.    Marland Kitchen. omeprazole (PRILOSEC) 40 MG capsule Take 40 mg by mouth daily.  1  . Polyethyl Glycol-Propyl Glycol (SYSTANE OP) Apply 1 drop to eye 2 (two) times daily.    . potassium chloride (K-DUR) 10 MEQ tablet Take 1 tablet (10 mEq total) by mouth daily. 30 tablet 1  . Prenat-FeFmCb-DSS-FA-DHA w/o A (CITRANATAL HARMONY) 27-1-260 MG CAPS Take 1 capsule by mouth daily.  11  . pyridOXINE (VITAMIN B-6) 100 MG tablet Take 100 mg by mouth daily.    . ranitidine (ZANTAC) 150 MG tablet Take 150 mg by mouth daily.  0  .  sertraline (ZOLOFT) 100 MG tablet Take 100 mg by mouth daily.  2   No current facility-administered medications for this visit.    Allergies:   Codeine; Levaquin; Seroquel; Vicodin; and Wellbutrin    Social History:  The patient  reports that she has never smoked. She has never used smokeless tobacco. She reports that she does not drink alcohol or use illicit drugs.   Family History:  The patient's family history includes Arthritis in her mother; Depression in her father; Diabetes in her father and mother; Heart disease in her father; Hypertension in her mother.    ROS:  Please see the history of present illness.   Otherwise, review of systems are positive for none.   All other systems are reviewed and negative.    PHYSICAL EXAM: VS:  BP 110/72 mmHg  Pulse 82  Ht 5\' 2"  (1.575 m)  Wt 229 lb 12.8 oz (104.237 kg)  BMI 42.02 kg/m2  SpO2  98%  LMP 03/03/2015  Breastfeeding? No , BMI Body mass index is 42.02 kg/(m^2). GEN: Well nourished, well developed, in no acute distress HEENT: normal Neck: no JVD, carotid bruits, or masses Cardiac: RRR; no murmurs, rubs, or gallops,no edema  Respiratory:  clear to auscultation bilaterally, normal work of breathing GI: soft, nontender, nondistended, + BS MS: no deformity or atrophy Skin: warm and dry, no rash Neuro:  Strength and sensation are intact Psych: euthymic mood, full affect   EKG:  EKG is not ordered today.    Recent Labs: 01/16/2015: ALT 16 01/21/2015: B Natriuretic Peptide 224.0* 01/22/2015: Hemoglobin 8.7*; Platelets 283 01/23/2015: BUN 9; Creatinine 0.59; Potassium 4.2; Sodium 142    Lipid Panel No results found for: CHOL, TRIG, HDL, CHOLHDL, VLDL, LDLCALC, LDLDIRECT    Wt Readings from Last 3 Encounters:  04/09/15 229 lb 12.8 oz (104.237 kg)  02/05/15 222 lb 9.6 oz (100.971 kg)  01/23/15 237 lb 14.4 oz (107.911 kg)     ASSESSMENT AND PLAN:  1. Atypical chest pain that seems to have occurred when her abdomen got bigger. CP is most likely due to GI etiology and has resolved after starting Zantac. 2. SOB - resolved 3. GERD 4. Type II DM   5.   Acute diastolic CHF immediately after C section most likely related to fluids given during C section as well as anemia. 2D echo with preserved LVF and normal LV size. This has resolved.   Current medicines are reviewed at length with the patient today.  The patient does not have concerns regarding medicines.  The following changes have been made:  no change  Labs/ tests ordered today include: see above assessment and plan No orders of the defined types were placed in this encounter.     Disposition:   FU with me PRN   Signed, Quintella ReichertURNER,TRACI R, MD  04/09/2015 8:29 AM    [facility] [address], [place], [place]  [address] Phone: [phone]; Fax: [phone]

## 2015-05-20 ENCOUNTER — Other Ambulatory Visit: Payer: Self-pay

## 2015-06-24 ENCOUNTER — Telehealth: Payer: Self-pay | Admitting: Cardiology

## 2015-06-24 DIAGNOSIS — R002 Palpitations: Secondary | ICD-10-CM

## 2015-06-24 NOTE — Telephone Encounter (Signed)
New message      Pt is having more symptoms than normal with mitral valve prolapse Please call to discuss

## 2015-06-24 NOTE — Telephone Encounter (Signed)
Last echo did not show MVP just mild MR which is physiologic.  Please get a 30 day heart monitor for palpitations

## 2015-06-24 NOTE — Telephone Encounter (Signed)
Patient agrees to event monitor. Monitor ordered for scheduling. 

## 2015-06-24 NOTE — Telephone Encounter (Signed)
Patient c/o her heart "skipping" more and more in the 10 or so days, especially with activity. She is worried about her MVP. She st she is avoiding heat, but her stress has increased since the baby was born 5 months ago and she's been out of work.  She has no other complaints other than "heart skipping." No dizziness, SOB, or CP. She did have a Morena placed after giving birth in April.  Patient has no VS to report - she has not checked her BP in months. Patient requests recommendations from Dr. Mayford Knife.

## 2015-06-25 ENCOUNTER — Ambulatory Visit (INDEPENDENT_AMBULATORY_CARE_PROVIDER_SITE_OTHER): Payer: 59

## 2015-06-25 DIAGNOSIS — R002 Palpitations: Secondary | ICD-10-CM

## 2016-03-19 ENCOUNTER — Other Ambulatory Visit: Payer: Self-pay | Admitting: Orthopedic Surgery

## 2016-03-19 DIAGNOSIS — M5416 Radiculopathy, lumbar region: Secondary | ICD-10-CM

## 2016-04-07 ENCOUNTER — Ambulatory Visit: Payer: Managed Care, Other (non HMO)

## 2016-04-08 ENCOUNTER — Other Ambulatory Visit: Payer: Self-pay | Admitting: Orthopedic Surgery

## 2016-04-08 DIAGNOSIS — M5416 Radiculopathy, lumbar region: Secondary | ICD-10-CM

## 2016-04-12 ENCOUNTER — Ambulatory Visit
Admission: RE | Admit: 2016-04-12 | Discharge: 2016-04-12 | Disposition: A | Discharge status: Home / Self Care | Payer: Managed Care, Other (non HMO) | Source: Ambulatory Visit | Attending: Orthopedic Surgery | Admitting: Orthopedic Surgery

## 2016-04-12 DIAGNOSIS — M5416 Radiculopathy, lumbar region: Secondary | ICD-10-CM

## 2017-02-13 ENCOUNTER — Ambulatory Visit (INDEPENDENT_AMBULATORY_CARE_PROVIDER_SITE_OTHER): Payer: Managed Care, Other (non HMO) | Admitting: Physician Assistant

## 2017-02-13 VITALS — BP 120/85 | HR 82 | Temp 98.7°F | Resp 16 | Ht 62.0 in | Wt 248.0 lb

## 2017-02-13 DIAGNOSIS — S61211A Laceration without foreign body of left index finger without damage to nail, initial encounter: Secondary | ICD-10-CM

## 2017-02-13 DIAGNOSIS — Z23 Encounter for immunization: Secondary | ICD-10-CM

## 2017-02-13 MED ORDER — CEPHALEXIN 500 MG PO CAPS: 500.0000 mg | ORAL_CAPSULE | Freq: Two times a day (BID) | ORAL | 0 refills | Status: AC

## 2017-02-13 NOTE — Progress Notes (Signed)
Jessica AdjutantRobyn Baca  MRN: [id number] DOB: 12/22/1980  Subjective:  [name] is a 36 y.o. female seen in office today for a chief complaint of laceration on left index finger 3 hours prior to arrival. She was cutting onions with a clean knife when it slipped and got her finger. She immediately applied pressure to it however she could get it to stop bleeding so she decided to come in. Has some associated pain. Denies numbness, tingling, loss of ROM, and decreased strength. She is not up to date on tdap vaccine.   Review of Systems  Constitutional: Negative for chills, diaphoresis and fever.    Patient Active Problem List   Diagnosis Date Noted  . Diastolic CHF (HCC)   . Gestational hypertension, antepartum 01/16/2015  . GERD (gastroesophageal reflux disease) 12/24/2014  . BMI 37.0-37.9, adult 10/03/2013  . Depression 04/16/2013    Current Outpatient Prescriptions on File Prior to Visit  Medication Sig Dispense Refill  . acetaminophen (TYLENOL) 500 MG tablet Take 1,000 mg by mouth every 6 (six) hours as needed for moderate pain.    . calcium carbonate (TUMS - DOSED IN MG ELEMENTAL CALCIUM) 500 MG chewable tablet Chew 2 tablets by mouth 3 (three) times daily as needed for indigestion or heartburn.    . loratadine (CLARITIN) 10 MG tablet Take 10 mg by mouth daily as needed for allergies.     Marland Kitchen. LORazepam (ATIVAN) 0.5 MG tablet Take 1 tablet (0.5 mg total) by mouth every 4 (four) hours as needed for anxiety. 10 tablet 0  . Omega-3 Fatty Acids (FISH OIL) 1200 MG CAPS Take 1 capsule by mouth daily.    . ranitidine (ZANTAC) 150 MG tablet Take 150 mg by mouth daily.  0  . sertraline (ZOLOFT) 100 MG tablet Take 100 mg by mouth daily.  2  . ferrous fumarate (HEMOCYTE - 106 MG FE) 325 (106 FE) MG TABS tablet Take 1 tablet by mouth daily.    Marland Kitchen. ibuprofen (ADVIL,MOTRIN) 200 MG tablet Take 400 mg by mouth every 6 (six) hours as needed for headache or mild pain.    Marland Kitchen. levonorgestrel (MIRENA) 20 MCG/24HR IUD 1  each by Intrauterine route once.    Marland Kitchen. omeprazole (PRILOSEC) 40 MG capsule Take 40 mg by mouth daily.  1  . Polyethyl Glycol-Propyl Glycol (SYSTANE OP) Apply 1 drop to eye 2 (two) times daily.    . potassium chloride (K-DUR) 10 MEQ tablet Take 1 tablet (10 mEq total) by mouth daily. (Patient not taking: Reported on 02/13/2017) 30 tablet 1  . Prenat-FeFmCb-DSS-FA-DHA w/o A (CITRANATAL HARMONY) 27-1-260 MG CAPS Take 1 capsule by mouth daily.  11  . pyridOXINE (VITAMIN B-6) 100 MG tablet Take 100 mg by mouth daily.     No current facility-administered medications on file prior to visit.     Allergies  Allergen Reactions  . Codeine Nausea And Vomiting  . Levaquin [Levofloxacin In D5w] Nausea And Vomiting  . Seroquel [Quetiapine Fumarate] Other (See Comments)    Reaction:  Heavy sedation   . Vicodin [Hydrocodone-Acetaminophen] Nausea And Vomiting  . Wellbutrin [Bupropion] Other (See Comments)    Reaction:  Seizures     Objective:  BP 120/85 (BP Location: Right Arm, Patient Position: Sitting, Cuff Size: Large)   Pulse 82   Temp 98.7 F (37.1 C) (Oral)   Resp 16   Ht 5\' 2"  (1.575 m)   Wt 248 lb (112.5 kg)   SpO2 99%   BMI 45.36 kg/m   Physical  Exam  Constitutional: She is oriented to person, place, and time and well-developed, well-nourished, and in no distress.  HENT:  Head: Normocephalic and atraumatic.  Eyes: Conjunctivae are normal.  Neck: Normal range of motion.  Pulmonary/Chest: Effort normal.  Neurological: She is alert and oriented to person, place, and time. Gait normal.  Skin: Skin is warm and dry. Laceration ( 1 cm superfical laceration with continuous bleeding noted on the distal lateral portion of dorsal aspect of 2nd left digit, no nail involvement) noted. Lesion:    Psychiatric: Affect normal.  Vitals reviewed.  PROCEDURE NOTE: laceration repair Verbal consent obtained from patient.  Digital block anesthesia with 2cc Lidocaine 2% without epinephrine.  Wound  explored for tendon, ligament damage. Wound scrubbed with soap and water and rinsed. Foil graft was secured using #7 5-0 Ethilon simple interrupted sutures.  Wound cleansed and dressed.  Assessment and Plan :  1. Laceration of left index finger without foreign body without damage to nail, initial encounter Wound care instructions given. Prophylactic antibiotics prescribed since foil graft was utilized. Pt instructed to return in 10 days for recheck.  - Tdap vaccine greater than or equal to 7yo IM - cephALEXin (KEFLEX) 500 MG capsule; Take 1 capsule (500 mg total) by mouth 2 (two) times daily.  Dispense: 14 capsule; Refill: 0   Benjiman Core PA-C  [facility] 02/13/2017 4:01 PM

## 2017-02-13 NOTE — Patient Instructions (Addendum)
Take antibiotics as prescribed.   WOUND CARE Please return in 10 days to have your stitches/staples removed or sooner if you have concerns. Marland Kitchen. Keep area clean and dry for 24 hours. Do not remove bandage, if applied. . After 24 hours, remove bandage and wash wound gently with mild soap and warm water. Reapply a new bandage after cleaning wound, if directed. . Continue daily cleansing with soap and water until stitches/staples are removed. . Do not apply any ointments or creams to the wound while stitches/staples are in place, as this may cause delayed healing. . Notify the office if you experience any of the following signs of infection: Swelling, redness, pus drainage, streaking, fever >101.0 F . Notify the office if you experience excessive bleeding that does not stop after 15-20 minutes of constant, firm pressure.     IF you received an x-ray today, you will receive an invoice from [facility]. Please contact [facility] at [phone] with questions or concerns regarding your invoice.   IF you received labwork today, you will receive an invoice from [facility]. Please contact [facility] at [phone] with questions or concerns regarding your invoice.   Our billing staff will not be able to assist you with questions regarding bills from these companies.  You will be contacted with the lab results as soon as they are available. The fastest way to get your results is to activate your My Chart account. Instructions are located on the last page of this paperwork. If you have not heard from us regarding the results in 2 weeks, please contact this office.

## 2017-02-23 ENCOUNTER — Ambulatory Visit (INDEPENDENT_AMBULATORY_CARE_PROVIDER_SITE_OTHER): Payer: Managed Care, Other (non HMO) | Admitting: Urgent Care

## 2017-02-23 VITALS — BP 115/80 | HR 81 | Temp 98.2°F | Resp 16

## 2017-02-23 DIAGNOSIS — Z4802 Encounter for removal of sutures: Secondary | ICD-10-CM

## 2017-02-23 DIAGNOSIS — S61211A Laceration without foreign body of left index finger without damage to nail, initial encounter: Secondary | ICD-10-CM

## 2017-02-23 NOTE — Patient Instructions (Addendum)
Suture Removal, Care After Refer to this sheet in the next few weeks. These instructions provide you with information on caring for yourself after your procedure. Your health care provider may also give you more specific instructions. Your treatment has been planned according to current medical practices, but problems sometimes occur. Call your health care provider if you have any problems or questions after your procedure. What can I expect after the procedure? After your stitches (sutures) are removed, it is typical to have the following:  Some discomfort and swelling in the wound area.  Slight redness in the area.  Follow these instructions at home:  If you have skin adhesive strips over the wound area, do not take the strips off. They will fall off on their own in a few days. If the strips remain in place after 14 days, you may remove them.  Change any bandages (dressings) at least once a day or as directed by your health care provider. If the bandage sticks, soak it off with warm, soapy water.  Apply cream or ointment only as directed by your health care provider. If using cream or ointment, wash the area with soap and water 2 times a day to remove all the cream or ointment. Rinse off the soap and pat the area dry with a clean towel.  Keep the wound area dry and clean. If the bandage becomes wet or dirty, or if it develops a bad smell, change it as soon as possible.  Continue to protect the wound from injury.  Use sunscreen when out in the sun. New scars become sunburned easily. Contact a health care provider if:  You have increasing redness, swelling, or pain in the wound.  You see pus coming from the wound.  You have a fever.  You notice a bad smell coming from the wound or dressing.  Your wound breaks open (edges not staying together). This information is not intended to replace advice given to you by your health care provider. Make sure you discuss any questions you have  with your health care provider. Document Released: 08/04/2001 Document Revised: 04/16/2016 Document Reviewed: 06/21/2013 Elsevier Interactive Patient Education  2017 Elsevier Inc.     IF you received an x-ray today, you will receive an invoice from [facility]. Please contact [facility] at [phone] with questions or concerns regarding your invoice.   IF you received labwork today, you will receive an invoice from [facility]. Please contact [facility] at [phone] with questions or concerns regarding your invoice.   Our billing staff will not be able to assist you with questions regarding bills from these companies.  You will be contacted with the lab results as soon as they are available. The fastest way to get your results is to activate your My Chart account. Instructions are located on the last page of this paperwork. If you have not heard from us regarding the results in 2 weeks, please contact this office.      

## 2017-02-23 NOTE — Progress Notes (Signed)
   Patient: [name] [id number]  Subjective: [name] is returning for suture removal. Patient was initially seen 02/13/2017 and had foil graft placed. Denies fever, drainage of pus or blood, wound dehiscence, edema, pain.   Objective: BP 115/80   Pulse 81   Temp 98.2 F (36.8 C) (Oral)   Resp 16   SpO2 100%   Physical Exam  Constitutional: She is oriented to person, place, and time. She appears well-developed and well-nourished.  Cardiovascular: Normal rate.   Pulmonary/Chest: Effort normal.  Musculoskeletal:       Hands: Neurological: She is alert and oriented to person, place, and time.  Skin: Skin is warm and dry. Capillary refill takes less than 2 seconds.    #2 outermost sutures removed without incident. Patient tolerated this well.  Assessment and Plan: Healing wound. RTC on 02/27/2017 for removal of last sutures.  Wallis Bamberg, PA-C [facility] [phone] 02/23/2017  5:19 PM

## 2017-02-27 ENCOUNTER — Ambulatory Visit (INDEPENDENT_AMBULATORY_CARE_PROVIDER_SITE_OTHER): Payer: Managed Care, Other (non HMO) | Admitting: Urgent Care

## 2017-02-27 VITALS — BP 99/70 | HR 81 | Temp 97.4°F | Resp 16

## 2017-02-27 DIAGNOSIS — Z4802 Encounter for removal of sutures: Secondary | ICD-10-CM

## 2017-02-27 DIAGNOSIS — S61211A Laceration without foreign body of left index finger without damage to nail, initial encounter: Secondary | ICD-10-CM

## 2017-02-27 NOTE — Progress Notes (Signed)
   Patient: Maela Takeda [id number]  Subjective: Latiya is returning for suture removal. Last OV was 02/23/2017, had #2 sutures removed but still had opend wound under central portion of foil graft, advised to come in today for final suture removal. Denies fever, drainage of pus or blood, wound dehiscence, edema, pain.   Objective: BP 99/70   Pulse 81   Temp 97.4 F (36.3 C) (Oral)   Resp 16   SpO2 95%     Physical Exam  Constitutional: She is oriented to person, place, and time. She appears well-developed and well-nourished.  Cardiovascular: Normal rate.   Pulmonary/Chest: Effort normal.  Musculoskeletal:       Hands: Neurological: She is alert and oriented to person, place, and time.   #5 sutures removed without incident. Patient tolerated this well.  Assessment and Plan: Well-healed wound. Wound care reviewed. Anticipatory guidance provided. Return to clinic as needed.  Wallis Bamberg, PA-C [facility] [phone] 02/27/2017  8:52 AM

## 2017-02-27 NOTE — Patient Instructions (Signed)
Suture Removal, Care After Refer to this sheet in the next few weeks. These instructions provide you with information on caring for yourself after your procedure. Your health care provider may also give you more specific instructions. Your treatment has been planned according to current medical practices, but problems sometimes occur. Call your health care provider if you have any problems or questions after your procedure. What can I expect after the procedure? After your stitches (sutures) are removed, it is typical to have the following:  Some discomfort and swelling in the wound area.  Slight redness in the area.  Follow these instructions at home:  If you have skin adhesive strips over the wound area, do not take the strips off. They will fall off on their own in a few days. If the strips remain in place after 14 days, you may remove them.  Change any bandages (dressings) at least once a day or as directed by your health care provider. If the bandage sticks, soak it off with warm, soapy water.  Apply cream or ointment only as directed by your health care provider. If using cream or ointment, wash the area with soap and water 2 times a day to remove all the cream or ointment. Rinse off the soap and pat the area dry with a clean towel.  Keep the wound area dry and clean. If the bandage becomes wet or dirty, or if it develops a bad smell, change it as soon as possible.  Continue to protect the wound from injury.  Use sunscreen when out in the sun. New scars become sunburned easily. Contact a health care provider if:  You have increasing redness, swelling, or pain in the wound.  You see pus coming from the wound.  You have a fever.  You notice a bad smell coming from the wound or dressing.  Your wound breaks open (edges not staying together). This information is not intended to replace advice given to you by your health care provider. Make sure you discuss any questions you have  with your health care provider. Document Released: 08/04/2001 Document Revised: 04/16/2016 Document Reviewed: 06/21/2013 Elsevier Interactive Patient Education  2017 Elsevier Inc.  

## 2018-04-22 ENCOUNTER — Ambulatory Visit: Payer: 59 | Admitting: Urgent Care

## 2018-04-22 ENCOUNTER — Encounter: Payer: Self-pay | Admitting: Urgent Care

## 2018-04-22 ENCOUNTER — Other Ambulatory Visit: Payer: Self-pay

## 2018-04-22 VITALS — BP 111/75 | HR 103 | Temp 99.2°F | Resp 18 | Ht 63.43 in | Wt 241.8 lb

## 2018-04-22 DIAGNOSIS — J020 Streptococcal pharyngitis: Secondary | ICD-10-CM

## 2018-04-22 DIAGNOSIS — R070 Pain in throat: Secondary | ICD-10-CM

## 2018-04-22 LAB — POCT RAPID STREP A (OFFICE): Rapid Strep A Screen: POSITIVE — AB

## 2018-04-22 MED ORDER — AMOXICILLIN 875 MG PO TABS: 875.0000 mg | ORAL_TABLET | Freq: Two times a day (BID) | ORAL | 0 refills | Status: AC

## 2018-04-22 NOTE — Patient Instructions (Addendum)
You may take  Tylenol with ibuprofen 400-600mg  every 6 hours for pain and inflammation.     Strep Throat Strep throat is a bacterial infection of the throat. Your health care provider may call the infection tonsillitis or pharyngitis, depending on whether there is swelling in the tonsils or at the back of the throat. Strep throat is most common during the cold months of the year in children who are 1-39 years of age, but it can happen during any season in people of any age. This infection is spread from person to person (contagious) through coughing, sneezing, or close contact. What are the causes? Strep throat is caused by the bacteria called Streptococcus pyogenes. What increases the risk? This condition is more likely to develop in:  People who spend time in crowded places where the infection can spread easily.  People who have close contact with someone who has strep throat.  What are the signs or symptoms? Symptoms of this condition include:  Fever or chills.  Redness, swelling, or pain in the tonsils or throat.  Pain or difficulty when swallowing.  White or yellow spots on the tonsils or throat.  Swollen, tender glands in the neck or under the jaw.  Red rash all over the body (rare).  How is this diagnosed? This condition is diagnosed by performing a rapid strep test or by taking a swab of your throat (throat culture test). Results from a rapid strep test are usually ready in a few minutes, but throat culture test results are available after one or two days. How is this treated? This condition is treated with antibiotic medicine. Follow these instructions at home: Medicines  Take over-the-counter and prescription medicines only as told by your health care provider.  Take your antibiotic as told by your health care provider. Do not stop taking the antibiotic even if you start to feel better.  Have family members who also have a sore throat or fever tested for strep  throat. They may need antibiotics if they have the strep infection. Eating and drinking  Do not share food, drinking cups, or personal items that could cause the infection to spread to other people.  If swallowing is difficult, try eating soft foods until your sore throat feels better.  Drink enough fluid to keep your urine clear or pale yellow. General instructions  Gargle with a salt-water mixture 3-4 times per day or as needed. To make a salt-water mixture, completely dissolve -1 tsp of salt in 1 cup of warm water.  Make sure that all household members wash their hands well.  Get plenty of rest.  Stay home from school or work until you have been taking antibiotics for 24 hours.  Keep all follow-up visits as told by your health care provider. This is important. Contact a health care provider if:  The glands in your neck continue to get bigger.  You develop a rash, cough, or earache.  You cough up a thick liquid that is green, yellow-brown, or bloody.  You have pain or discomfort that does not get better with medicine.  Your problems seem to be getting worse rather than better.  You have a fever. Get help right away if:  You have new symptoms, such as vomiting, severe headache, stiff or painful neck, chest pain, or shortness of breath.  You have severe throat pain, drooling, or changes in your voice.  You have swelling of the neck, or the skin on the neck becomes red and tender.  You have signs of dehydration, such as fatigue, dry mouth, and decreased urination.  You become increasingly sleepy, or you cannot wake up completely.  Your joints become red or painful. This information is not intended to replace advice given to you by your health care provider. Make sure you discuss any questions you have with your health care provider. Document Released: 11/06/2000 Document Revised: 07/08/2016 Document Reviewed: 03/04/2015 Elsevier Interactive Patient Education  2018  [employer].     IF you received an x-ray today, you will receive an invoice from [facility]. Please contact [facility] at [phone] with questions or concerns regarding your invoice.   IF you received labwork today, you will receive an invoice from [place]. Please contact [facility] at [phone] with questions or concerns regarding your invoice.   Our billing staff will not be able to assist you with questions regarding bills from these companies.  You will be contacted with the lab results as soon as they are available. The fastest way to get your results is to activate your My Chart account. Instructions are located on the last page of this paperwork. If you have not heard from [place] regarding the results in 2 weeks, please contact this office.

## 2018-04-22 NOTE — Progress Notes (Signed)
MRN: [id number] DOB: 1981/07/02  Subjective:   [name] is a 37 y.o. female presenting for 2 day history of worsening sore throat, difficulty swallowing, mild sinus congestion and post-nasal drainage, bilateral ear pain, lymph node pain. Denies fever, sinus pain, ear drainage, cough. Has tried otc Mucinex, Claritin, nasal saline, APAP. Denies smoking cigarettes.  A1c is at 5.7%.   [name] has a current medication list which includes the following prescription(s): acetaminophen, calcium carbonate, loratadine, lorazepam, ranitidine, sertraline, and fish oil. Also is allergic to codeine; levaquin [levofloxacin in d5w]; seroquel [quetiapine fumarate]; vicodin [hydrocodone-acetaminophen]; and wellbutrin [bupropion].  [name]  has a past medical history of Allergy, Anxiety, Arthritis, Asthma, Depression, Diabetes mellitus without complication (HCC), Diastolic CHF (HCC), GERD (gastroesophageal reflux disease), Gestational diabetes (2016), and IBS (irritable bowel syndrome). Also  has a past surgical history that includes Cholecystectomy; Ganglion cyst excision; Appendectomy; and Cesarean section (N/A, 01/17/2015).  Objective:   Vitals: BP 111/75 (BP Location: Right Arm, Patient Position: Sitting, Cuff Size: Large)   Pulse (!) 103   Temp 99.2 F (37.3 C) (Oral)   Resp 18   Ht 5' 3.43" (1.611 m)   Wt 241 lb 12.8 oz (109.7 kg)   LMP 04/15/2018 (Approximate)   SpO2 96%   BMI 42.26 kg/m   Physical Exam  Constitutional: She is oriented to person, place, and time. She appears well-developed and well-nourished.  HENT:  Right Ear: Tympanic membrane normal.  Left Ear: Tympanic membrane normal.  Nose: Sinus tenderness (mild bilateral maxillary) present.  Mouth/Throat: Posterior oropharyngeal edema and posterior oropharyngeal erythema present. No oropharyngeal exudate or tonsillar abscesses.  Cardiovascular: Normal rate.  Pulmonary/Chest: Effort normal.  Neurological: She is alert and oriented to  person, place, and time.    Results for orders placed or performed in visit on 04/22/18 (from the past 24 hour(s))  POCT rapid strep A     Status: Abnormal   Collection Time: 04/22/18  2:35 PM  Result Value Ref Range   Rapid Strep A Screen Positive (A) Negative    Assessment and Plan :   Throat pain - Plan: POCT rapid strep A  Start amoxicillin for strep pharyngitis.  Recommended supportive care otherwise.  Follow-up as needed.  Wallis Bamberg, PA-C [facility] [phone] 04/22/2018  2:19 PM

## 2019-04-26 ENCOUNTER — Other Ambulatory Visit: Payer: Self-pay | Admitting: Physician Assistant

## 2019-04-26 DIAGNOSIS — Z1231 Encounter for screening mammogram for malignant neoplasm of breast: Secondary | ICD-10-CM

## 2019-06-02 ENCOUNTER — Other Ambulatory Visit: Payer: Self-pay | Admitting: Physician Assistant

## 2019-06-02 DIAGNOSIS — Z1231 Encounter for screening mammogram for malignant neoplasm of breast: Secondary | ICD-10-CM

## 2019-07-18 ENCOUNTER — Ambulatory Visit
Admission: RE | Admit: 2019-07-18 | Discharge: 2019-07-18 | Disposition: A | Discharge status: Home / Self Care | Payer: Managed Care, Other (non HMO) | Source: Ambulatory Visit | Attending: Physician Assistant | Admitting: Physician Assistant

## 2019-07-18 ENCOUNTER — Other Ambulatory Visit: Payer: Self-pay

## 2019-07-18 DIAGNOSIS — Z1231 Encounter for screening mammogram for malignant neoplasm of breast: Secondary | ICD-10-CM

## 2020-04-17 ENCOUNTER — Other Ambulatory Visit: Payer: Self-pay | Admitting: Family Medicine

## 2020-04-17 DIAGNOSIS — G8929 Other chronic pain: Secondary | ICD-10-CM

## 2020-05-08 ENCOUNTER — Other Ambulatory Visit: Payer: Managed Care, Other (non HMO)

## 2020-05-18 ENCOUNTER — Other Ambulatory Visit: Payer: Managed Care, Other (non HMO)

## 2021-01-06 ENCOUNTER — Other Ambulatory Visit: Payer: Self-pay | Admitting: Physician Assistant

## 2021-01-06 DIAGNOSIS — Z1231 Encounter for screening mammogram for malignant neoplasm of breast: Secondary | ICD-10-CM

## 2021-01-22 ENCOUNTER — Other Ambulatory Visit: Payer: Self-pay

## 2021-01-22 ENCOUNTER — Ambulatory Visit
Admission: RE | Admit: 2021-01-22 | Discharge: 2021-01-22 | Disposition: A | Discharge status: Home / Self Care | Payer: Managed Care, Other (non HMO) | Source: Ambulatory Visit | Attending: Physician Assistant | Admitting: Physician Assistant

## 2021-01-22 DIAGNOSIS — Z1231 Encounter for screening mammogram for malignant neoplasm of breast: Secondary | ICD-10-CM | POA: Insufficient documentation

## 2022-05-18 ENCOUNTER — Other Ambulatory Visit: Payer: Self-pay | Admitting: Family Medicine

## 2022-05-18 DIAGNOSIS — Z1231 Encounter for screening mammogram for malignant neoplasm of breast: Secondary | ICD-10-CM

## 2022-06-24 ENCOUNTER — Ambulatory Visit
Admission: RE | Admit: 2022-06-24 | Discharge: 2022-06-24 | Disposition: A | Discharge status: Home / Self Care | Payer: Managed Care, Other (non HMO) | Source: Ambulatory Visit | Attending: Family Medicine | Admitting: Family Medicine

## 2022-06-24 DIAGNOSIS — Z1231 Encounter for screening mammogram for malignant neoplasm of breast: Secondary | ICD-10-CM | POA: Insufficient documentation

## 2023-07-16 ENCOUNTER — Other Ambulatory Visit: Payer: Self-pay | Admitting: Family Medicine

## 2023-07-16 DIAGNOSIS — Z1231 Encounter for screening mammogram for malignant neoplasm of breast: Secondary | ICD-10-CM

## 2023-07-19 ENCOUNTER — Ambulatory Visit
Admission: RE | Admit: 2023-07-19 | Discharge: 2023-07-19 | Disposition: A | Discharge status: Home / Self Care | Payer: Managed Care, Other (non HMO) | Source: Ambulatory Visit | Attending: Family Medicine | Admitting: Family Medicine

## 2023-07-19 DIAGNOSIS — Z1231 Encounter for screening mammogram for malignant neoplasm of breast: Secondary | ICD-10-CM | POA: Insufficient documentation

## 2023-08-24 ENCOUNTER — Other Ambulatory Visit: Payer: Self-pay

## 2023-08-24 MED ORDER — MOUNJARO 7.5 MG/0.5ML ~~LOC~~ SOAJ
7.5000 mg | SUBCUTANEOUS | 3 refills | Status: AC
  Filled 2023-08-24: qty 2, 28d supply, fill #0

## 2023-08-25 ENCOUNTER — Other Ambulatory Visit: Payer: Self-pay

## 2023-12-29 ENCOUNTER — Other Ambulatory Visit: Payer: Self-pay | Admitting: Family Medicine

## 2023-12-29 DIAGNOSIS — R112 Nausea with vomiting, unspecified: Secondary | ICD-10-CM

## 2023-12-29 DIAGNOSIS — W19XXXA Unspecified fall, initial encounter: Secondary | ICD-10-CM
